# Patient Record
Sex: Female | Born: 1960 | Race: White | Hispanic: No | Marital: Married | Smoking: Never smoker
Health system: Southern US, Community
[De-identification: ages and names within clinical notes are randomized; demographics above are authoritative.]

## PROBLEM LIST (undated history)

## (undated) DIAGNOSIS — J309 Allergic rhinitis, unspecified: Secondary | ICD-10-CM

## (undated) DIAGNOSIS — R7303 Prediabetes: Secondary | ICD-10-CM

## (undated) DIAGNOSIS — D069 Carcinoma in situ of cervix, unspecified: Secondary | ICD-10-CM

## (undated) DIAGNOSIS — Z973 Presence of spectacles and contact lenses: Secondary | ICD-10-CM

## (undated) DIAGNOSIS — E785 Hyperlipidemia, unspecified: Secondary | ICD-10-CM

## (undated) DIAGNOSIS — F32A Depression, unspecified: Secondary | ICD-10-CM

## (undated) DIAGNOSIS — G4733 Obstructive sleep apnea (adult) (pediatric): Secondary | ICD-10-CM

---

## 1995-11-12 HISTORY — PX: REDUCTION MAMMAPLASTY: SUR839

## 2016-09-23 DIAGNOSIS — E785 Hyperlipidemia, unspecified: Secondary | ICD-10-CM | POA: Insufficient documentation

## 2016-09-23 DIAGNOSIS — G4700 Insomnia, unspecified: Secondary | ICD-10-CM | POA: Insufficient documentation

## 2016-09-23 DIAGNOSIS — J309 Allergic rhinitis, unspecified: Secondary | ICD-10-CM | POA: Insufficient documentation

## 2016-09-23 DIAGNOSIS — E119 Type 2 diabetes mellitus without complications: Secondary | ICD-10-CM | POA: Insufficient documentation

## 2019-05-05 DIAGNOSIS — E669 Obesity, unspecified: Secondary | ICD-10-CM | POA: Insufficient documentation

## 2019-05-10 ENCOUNTER — Other Ambulatory Visit: Payer: Self-pay | Admitting: Obstetrics and Gynecology

## 2019-05-10 DIAGNOSIS — Z1231 Encounter for screening mammogram for malignant neoplasm of breast: Secondary | ICD-10-CM

## 2019-05-12 ENCOUNTER — Ambulatory Visit
Admission: RE | Admit: 2019-05-12 | Discharge: 2019-05-12 | Disposition: A | Discharge status: Home / Self Care | Payer: Self-pay | Source: Ambulatory Visit | Attending: Obstetrics and Gynecology | Admitting: Obstetrics and Gynecology

## 2019-05-12 ENCOUNTER — Other Ambulatory Visit: Payer: Self-pay

## 2019-05-12 DIAGNOSIS — Z1231 Encounter for screening mammogram for malignant neoplasm of breast: Secondary | ICD-10-CM

## 2020-06-12 ENCOUNTER — Other Ambulatory Visit: Payer: Self-pay | Admitting: Obstetrics and Gynecology

## 2020-06-12 DIAGNOSIS — Z1231 Encounter for screening mammogram for malignant neoplasm of breast: Secondary | ICD-10-CM

## 2020-06-21 ENCOUNTER — Other Ambulatory Visit: Payer: Self-pay

## 2020-06-21 ENCOUNTER — Ambulatory Visit
Admission: RE | Admit: 2020-06-21 | Discharge: 2020-06-21 | Disposition: A | Discharge status: Home / Self Care | Payer: Self-pay | Source: Ambulatory Visit | Attending: Obstetrics and Gynecology | Admitting: Obstetrics and Gynecology

## 2020-06-21 DIAGNOSIS — Z1231 Encounter for screening mammogram for malignant neoplasm of breast: Secondary | ICD-10-CM

## 2020-09-28 DIAGNOSIS — R87810 Cervical high risk human papillomavirus (HPV) DNA test positive: Secondary | ICD-10-CM | POA: Insufficient documentation

## 2020-10-04 DIAGNOSIS — D069 Carcinoma in situ of cervix, unspecified: Secondary | ICD-10-CM | POA: Insufficient documentation

## 2020-10-26 DIAGNOSIS — E119 Type 2 diabetes mellitus without complications: Secondary | ICD-10-CM | POA: Insufficient documentation

## 2020-10-26 DIAGNOSIS — F32A Depression, unspecified: Secondary | ICD-10-CM | POA: Insufficient documentation

## 2020-10-26 DIAGNOSIS — R87619 Unspecified abnormal cytological findings in specimens from cervix uteri: Secondary | ICD-10-CM | POA: Insufficient documentation

## 2020-12-13 ENCOUNTER — Other Ambulatory Visit: Payer: Self-pay

## 2020-12-13 ENCOUNTER — Other Ambulatory Visit (HOSPITAL_COMMUNITY)
Admission: RE | Admit: 2020-12-13 | Discharge: 2020-12-13 | Disposition: A | Discharge status: Home / Self Care | Payer: 59 | Source: Ambulatory Visit | Attending: Obstetrics and Gynecology | Admitting: Obstetrics and Gynecology

## 2020-12-13 ENCOUNTER — Encounter (HOSPITAL_BASED_OUTPATIENT_CLINIC_OR_DEPARTMENT_OTHER): Payer: Self-pay | Admitting: Obstetrics and Gynecology

## 2020-12-13 DIAGNOSIS — Z20822 Contact with and (suspected) exposure to covid-19: Secondary | ICD-10-CM | POA: Insufficient documentation

## 2020-12-13 DIAGNOSIS — Z01812 Encounter for preprocedural laboratory examination: Secondary | ICD-10-CM | POA: Insufficient documentation

## 2020-12-13 NOTE — Progress Notes (Signed)
Spoke w/ via phone for pre-op interview--- PT Lab needs dos---- Istat, EKG, T&S              Lab results------ no COVID test ------ 12-13-2020 @ 1430 Arrive at ------- 0530 on 12-15-2020 NPO after MN NO Solid Food.  Clear liquids from MN until--- 0430 Medications to take morning of surgery ----- NONE Diabetic medication ----- n/a Patient Special Instructions ----- n/a Pre-Op special Istructions ----- d/c'd dr Jaynee Eagles urine preg pre-op orders since pt's is postmenopausal  Patient verbalized understanding of instructions that were given at this phone interview. Patient denies shortness of breath, chest pain, fever, cough at this phone interview.

## 2020-12-14 LAB — SARS CORONAVIRUS 2 (TAT 6-24 HRS): SARS Coronavirus 2: NEGATIVE

## 2020-12-14 NOTE — Anesthesia Preprocedure Evaluation (Addendum)
Anesthesia Evaluation  Patient identified by MRN, date of birth, ID band Patient awake    Reviewed: Allergy & Precautions, H&P , NPO status , Patient's Chart, lab work & pertinent test results  Airway Mallampati: II  TM Distance: >3 FB Neck ROM: Full    Dental no notable dental hx.    Pulmonary sleep apnea (mild; uses a mouth guard) ,    Pulmonary exam normal breath sounds clear to auscultation       Cardiovascular Exercise Tolerance: Good negative cardio ROS Normal cardiovascular exam Rhythm:Regular Rate:Normal     Neuro/Psych PSYCHIATRIC DISORDERS Depression negative neurological ROS  negative psych ROS   GI/Hepatic negative GI ROS, Neg liver ROS,   Endo/Other  negative endocrine ROShyperlipidemia  Renal/GU negative Renal ROS  negative genitourinary   Musculoskeletal negative musculoskeletal ROS (+)   Abdominal   Peds negative pediatric ROS (+)  Hematology negative hematology ROS (+)   Anesthesia Other Findings   Reproductive/Obstetrics negative OB ROS                            Anesthesia Physical Anesthesia Plan  ASA: II  Anesthesia Plan: General   Post-op Pain Management:    Induction: Intravenous  PONV Risk Score and Plan: Ondansetron, Dexamethasone, Midazolam and Treatment may vary due to age or medical condition  Airway Management Planned: LMA  Additional Equipment:   Intra-op Plan:   Post-operative Plan: Extubation in OR  Informed Consent: I have reviewed the patients History and Physical, chart, labs and discussed the procedure including the risks, benefits and alternatives for the proposed anesthesia with the patient or authorized representative who has indicated his/her understanding and acceptance.     Dental advisory given  Plan Discussed with: CRNA and Anesthesiologist  Anesthesia Plan Comments:        Anesthesia Quick Evaluation

## 2020-12-14 NOTE — H&P (Signed)
Gynecology History and Physical   Julie Maynard is a 60 y.o. 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   OB History   No obstetric history on file.    Past Medical History:  Diagnosis Date  . Allergic rhinitis   . CIN III (cervical intraepithelial neoplasia III)   . Depression   . Hyperlipidemia   . Mild obstructive sleep apnea    per pt had study few years ago, told very mild osa , no cpap recommended but does use mouth guard nightly  . Pre-diabetes   . Wears glasses    Past Surgical History:  Procedure Laterality Date  . REDUCTION MAMMAPLASTY Bilateral 1997   Family History: family history includes Breast cancer in her mother. Social History:  reports that she has never smoked. She has never used smokeless tobacco. She reports previous alcohol use. She reports that she does not use drugs.   Review of Systems History   Height 5\' 6"  (1.676 m), weight 80.3 kg. Exam Physical Exam from office encounter General: well appearing, no apparent distress Chest: nonlabored breathing CV: no peripheral edema Abdomen: nondistended  Assessment/Plan  - CIN3 on ECC.  Patient presents for scheduled cold knife conization.  Given postmenopausal status, will undergo endometrial sampling as well with dilation and curettage.  12/14/2020, 8:22 PM

## 2020-12-15 ENCOUNTER — Other Ambulatory Visit: Payer: Self-pay

## 2020-12-15 ENCOUNTER — Encounter (HOSPITAL_BASED_OUTPATIENT_CLINIC_OR_DEPARTMENT_OTHER): Payer: Self-pay | Admitting: Obstetrics and Gynecology

## 2020-12-15 ENCOUNTER — Ambulatory Visit (HOSPITAL_BASED_OUTPATIENT_CLINIC_OR_DEPARTMENT_OTHER)
Admission: RE | Admit: 2020-12-15 | Discharge: 2020-12-15 | Disposition: A | Discharge status: Home / Self Care | Payer: 59 | Attending: Obstetrics and Gynecology | Admitting: Obstetrics and Gynecology

## 2020-12-15 ENCOUNTER — Ambulatory Visit (HOSPITAL_BASED_OUTPATIENT_CLINIC_OR_DEPARTMENT_OTHER): Payer: 59 | Admitting: Anesthesiology

## 2020-12-15 ENCOUNTER — Encounter (HOSPITAL_BASED_OUTPATIENT_CLINIC_OR_DEPARTMENT_OTHER)
Admission: RE | Disposition: A | Discharge status: Home / Self Care | Payer: Self-pay | Source: Home / Self Care | Attending: Obstetrics and Gynecology

## 2020-12-15 DIAGNOSIS — D069 Carcinoma in situ of cervix, unspecified: Secondary | ICD-10-CM | POA: Diagnosis not present

## 2020-12-15 DIAGNOSIS — N882 Stricture and stenosis of cervix uteri: Secondary | ICD-10-CM | POA: Insufficient documentation

## 2020-12-15 DIAGNOSIS — R7303 Prediabetes: Secondary | ICD-10-CM | POA: Insufficient documentation

## 2020-12-15 DIAGNOSIS — Z803 Family history of malignant neoplasm of breast: Secondary | ICD-10-CM | POA: Diagnosis not present

## 2020-12-15 HISTORY — PX: HYSTEROSCOPY WITH D & C: SHX1775

## 2020-12-15 HISTORY — DX: Obstructive sleep apnea (adult) (pediatric): G47.33

## 2020-12-15 HISTORY — DX: Carcinoma in situ of cervix, unspecified: D06.9

## 2020-12-15 HISTORY — DX: Depression, unspecified: F32.A

## 2020-12-15 HISTORY — DX: Presence of spectacles and contact lenses: Z97.3

## 2020-12-15 HISTORY — DX: Hyperlipidemia, unspecified: E78.5

## 2020-12-15 HISTORY — DX: Allergic rhinitis, unspecified: J30.9

## 2020-12-15 HISTORY — PX: CERVICAL CONIZATION W/BX: SHX1330

## 2020-12-15 HISTORY — DX: Prediabetes: R73.03

## 2020-12-15 LAB — POCT I-STAT, CHEM 8
BUN: 16 mg/dL (ref 6–20)
Calcium, Ion: 1.26 mmol/L (ref 1.15–1.40)
Chloride: 105 mmol/L (ref 98–111)
Creatinine, Ser: 0.6 mg/dL (ref 0.44–1.00)
Glucose, Bld: 132 mg/dL — ABNORMAL HIGH (ref 70–99)
HCT: 41 % (ref 36.0–46.0)
Hemoglobin: 13.9 g/dL (ref 12.0–15.0)
Potassium: 4 mmol/L (ref 3.5–5.1)
Sodium: 142 mmol/L (ref 135–145)
TCO2: 25 mmol/L (ref 22–32)

## 2020-12-15 LAB — TYPE AND SCREEN
ABO/RH(D): A POS
Antibody Screen: NEGATIVE

## 2020-12-15 LAB — GLUCOSE, CAPILLARY: Glucose-Capillary: 117 mg/dL — ABNORMAL HIGH (ref 70–99)

## 2020-12-15 LAB — ABO/RH: ABO/RH(D): A POS

## 2020-12-15 SURGERY — DILATATION AND CURETTAGE /HYSTEROSCOPY
Anesthesia: General

## 2020-12-15 MED ORDER — ACETAMINOPHEN 500 MG PO TABS
ORAL_TABLET | ORAL | Status: AC
  Filled 2020-12-15: qty 2

## 2020-12-15 MED ORDER — PHENYLEPHRINE 40 MCG/ML (10ML) SYRINGE FOR IV PUSH (FOR BLOOD PRESSURE SUPPORT)
PREFILLED_SYRINGE | INTRAVENOUS | Status: AC
  Filled 2020-12-15: qty 10

## 2020-12-15 MED ORDER — FENTANYL CITRATE (PF) 100 MCG/2ML IJ SOLN
INTRAMUSCULAR | Status: AC
  Filled 2020-12-15: qty 2

## 2020-12-15 MED ORDER — FERRIC SUBSULFATE 259 MG/GM EX SOLN
CUTANEOUS | Status: DC | PRN
  Administered 2020-12-15: 1

## 2020-12-15 MED ORDER — DEXAMETHASONE SODIUM PHOSPHATE 10 MG/ML IJ SOLN
INTRAMUSCULAR | Status: DC | PRN
  Administered 2020-12-15: 10 mg via INTRAVENOUS

## 2020-12-15 MED ORDER — FENTANYL CITRATE (PF) 100 MCG/2ML IJ SOLN: 25.0000 ug | INTRAMUSCULAR | Status: DC | PRN

## 2020-12-15 MED ORDER — OXYCODONE HCL 5 MG/5ML PO SOLN
5.0000 mg | Freq: Once | ORAL | Status: DC | PRN
Start: 2020-12-15 — End: 2020-12-15

## 2020-12-15 MED ORDER — PROPOFOL 10 MG/ML IV BOLUS
INTRAVENOUS | Status: DC | PRN
  Administered 2020-12-15: 50 mg via INTRAVENOUS
  Administered 2020-12-15: 150 mg via INTRAVENOUS
  Administered 2020-12-15: 50 mg via INTRAVENOUS

## 2020-12-15 MED ORDER — EPHEDRINE SULFATE-NACL 50-0.9 MG/10ML-% IV SOSY
PREFILLED_SYRINGE | INTRAVENOUS | Status: DC | PRN
  Administered 2020-12-15 (×2): 15 mg via INTRAVENOUS

## 2020-12-15 MED ORDER — LACTATED RINGERS IV SOLN: INTRAVENOUS | Status: DC

## 2020-12-15 MED ORDER — SODIUM CHLORIDE 0.9 % IR SOLN
Status: DC | PRN
  Administered 2020-12-15: 1000 mL

## 2020-12-15 MED ORDER — EPHEDRINE 5 MG/ML INJ
INTRAVENOUS | Status: AC
  Filled 2020-12-15: qty 10

## 2020-12-15 MED ORDER — ONDANSETRON HCL 4 MG/2ML IJ SOLN
INTRAMUSCULAR | Status: AC
  Filled 2020-12-15: qty 2

## 2020-12-15 MED ORDER — DEXAMETHASONE SODIUM PHOSPHATE 10 MG/ML IJ SOLN
INTRAMUSCULAR | Status: AC
  Filled 2020-12-15: qty 1

## 2020-12-15 MED ORDER — ACETAMINOPHEN 500 MG PO TABS
1000.0000 mg | ORAL_TABLET | Freq: Once | ORAL | Status: AC
  Administered 2020-12-15: 1000 mg via ORAL

## 2020-12-15 MED ORDER — AMISULPRIDE (ANTIEMETIC) 5 MG/2ML IV SOLN: 10.0000 mg | Freq: Once | INTRAVENOUS | Status: DC | PRN

## 2020-12-15 MED ORDER — ONDANSETRON HCL 4 MG/2ML IJ SOLN
INTRAMUSCULAR | Status: DC | PRN
  Administered 2020-12-15: 4 mg via INTRAVENOUS

## 2020-12-15 MED ORDER — PROPOFOL 10 MG/ML IV BOLUS
INTRAVENOUS | Status: AC
  Filled 2020-12-15: qty 40

## 2020-12-15 MED ORDER — FENTANYL CITRATE (PF) 100 MCG/2ML IJ SOLN
INTRAMUSCULAR | Status: DC | PRN
  Administered 2020-12-15: 50 ug via INTRAVENOUS

## 2020-12-15 MED ORDER — OXYCODONE HCL 5 MG PO TABS: 5.0000 mg | ORAL_TABLET | ORAL | 0 refills | Status: DC | PRN

## 2020-12-15 MED ORDER — ARTIFICIAL TEARS OPHTHALMIC OINT
TOPICAL_OINTMENT | OPHTHALMIC | Status: AC
  Filled 2020-12-15: qty 3.5

## 2020-12-15 MED ORDER — IBUPROFEN 800 MG PO TABS: 800.0000 mg | ORAL_TABLET | Freq: Three times a day (TID) | ORAL | 0 refills | Status: DC | PRN

## 2020-12-15 MED ORDER — KETOROLAC TROMETHAMINE 30 MG/ML IJ SOLN
INTRAMUSCULAR | Status: AC
  Filled 2020-12-15: qty 1

## 2020-12-15 MED ORDER — VASOPRESSIN 20 UNIT/ML IV SOLN
INTRAVENOUS | Status: DC | PRN
  Administered 2020-12-15: 20 [IU]

## 2020-12-15 MED ORDER — SODIUM CHLORIDE 0.9 % IR SOLN
Status: DC | PRN
  Administered 2020-12-15: 50 mL

## 2020-12-15 MED ORDER — MIDAZOLAM HCL 2 MG/2ML IJ SOLN
INTRAMUSCULAR | Status: AC
  Filled 2020-12-15: qty 2

## 2020-12-15 MED ORDER — LIDOCAINE HCL (PF) 2 % IJ SOLN
INTRAMUSCULAR | Status: AC
  Filled 2020-12-15: qty 5

## 2020-12-15 MED ORDER — POVIDONE-IODINE 10 % EX SWAB: 2.0000 "application " | Freq: Once | CUTANEOUS | Status: DC

## 2020-12-15 MED ORDER — ONDANSETRON HCL 4 MG/2ML IJ SOLN: 4.0000 mg | Freq: Once | INTRAMUSCULAR | Status: DC | PRN

## 2020-12-15 MED ORDER — LIDOCAINE 2% (20 MG/ML) 5 ML SYRINGE
INTRAMUSCULAR | Status: DC | PRN
  Administered 2020-12-15: 80 mg via INTRAVENOUS

## 2020-12-15 MED ORDER — MIDAZOLAM HCL 2 MG/2ML IJ SOLN
INTRAMUSCULAR | Status: DC | PRN
  Administered 2020-12-15: 2 mg via INTRAVENOUS

## 2020-12-15 MED ORDER — ACETAMINOPHEN 325 MG PO TABS: 650.0000 mg | ORAL_TABLET | Freq: Four times a day (QID) | ORAL | 0 refills | Status: DC | PRN

## 2020-12-15 MED ORDER — KETOROLAC TROMETHAMINE 30 MG/ML IJ SOLN
INTRAMUSCULAR | Status: DC | PRN
  Administered 2020-12-15: 30 mg via INTRAVENOUS

## 2020-12-15 MED ORDER — OXYCODONE HCL 5 MG PO TABS
5.0000 mg | ORAL_TABLET | Freq: Once | ORAL | Status: DC | PRN
Start: 2020-12-15 — End: 2020-12-15

## 2020-12-15 SURGICAL SUPPLY — 22 items
CATH ROBINSON RED A/P 16FR (CATHETERS) ×2 IMPLANT
DEVICE MYOSURE LITE (MISCELLANEOUS) IMPLANT
DEVICE MYOSURE REACH (MISCELLANEOUS) IMPLANT
DILATOR CANAL MILEX (MISCELLANEOUS) ×1 IMPLANT
ELECT REM PT RETURN 9FT ADLT (ELECTROSURGICAL) ×2
ELECTRODE REM PT RTRN 9FT ADLT (ELECTROSURGICAL) IMPLANT
GLOVE SURG LTX SZ7 (GLOVE) ×4 IMPLANT
GLOVE SURG UNDER POLY LF SZ7 (GLOVE) ×2 IMPLANT
GOWN STRL REUS W/TWL LRG LVL3 (GOWN DISPOSABLE) ×4 IMPLANT
KIT PROCEDURE FLUENT (KITS) ×3 IMPLANT
KIT TURNOVER CYSTO (KITS) ×2 IMPLANT
PACK VAGINAL MINOR WOMEN LF (CUSTOM PROCEDURE TRAY) ×2 IMPLANT
PAD OB MATERNITY 4.3X12.25 (PERSONAL CARE ITEMS) ×2 IMPLANT
PENCIL BUTTON HOLSTER BLD 10FT (ELECTRODE) ×1 IMPLANT
SEAL ROD LENS SCOPE MYOSURE (ABLATOR) ×2 IMPLANT
SUT SILK 2 0 (SUTURE) ×1
SUT SILK 2-0 18XBRD TIE 12 (SUTURE) IMPLANT
SUT VIC AB 2-0 CT2 27 (SUTURE) ×2 IMPLANT
TOWEL OR 17X26 10 PK STRL BLUE (TOWEL DISPOSABLE) ×4 IMPLANT
TUBING SUCTION 1/4X6FT (MISCELLANEOUS) ×1 IMPLANT
UNDERPAD 30X36 HEAVY ABSORB (UNDERPADS AND DIAPERS) ×2 IMPLANT
YANKAUER SUCT BULB TIP NO VENT (SUCTIONS) ×1 IMPLANT

## 2020-12-15 NOTE — Anesthesia Procedure Notes (Signed)
Procedure Name: LMA Insertion Date/Time: 12/15/2020 7:37 AM Performed by: Norva Pavlov, CRNA Pre-anesthesia Checklist: Patient identified, Emergency Drugs available, Suction available and Patient being monitored Patient Re-evaluated:Patient Re-evaluated prior to induction Oxygen Delivery Method: Circle system utilized Preoxygenation: Pre-oxygenation with 100% oxygen Induction Type: IV induction Ventilation: Mask ventilation without difficulty LMA: LMA inserted LMA Size: 4.0 Number of attempts: 1 Airway Equipment and Method: Bite block Placement Confirmation: positive ETCO2 Tube secured with: Tape Dental Injury: Teeth and Oropharynx as per pre-operative assessment

## 2020-12-15 NOTE — Transfer of Care (Signed)
Immediate Anesthesia Transfer of Care Note  Patient: Julie Maynard  Procedure(s) Performed: Procedure(s) (LRB): DILATATION AND CURETTAGE /HYSTEROSCOPY (N/A) CONIZATION CERVIX WITH BIOPSY (N/A)  Patient Location: PACU  Anesthesia Type: General  Level of Consciousness: awake, alert  and oriented  Airway & Oxygen Therapy: Patient Spontanous Breathing and Patient connected to nasal cannula oxygen  Post-op Assessment: Report given to PACU RN and Post -op Vital signs reviewed and stable  Post vital signs: Reviewed and stable  Complications: No apparent anesthesia complications  Last Vitals:  Vitals Value Taken Time  BP 128/65 12/15/20 0840  Temp    Pulse 88 12/15/20 0843  Resp 10 12/15/20 0843  SpO2 100 % 12/15/20 0843  Vitals shown include unvalidated device data.  Last Pain:  Vitals:   12/15/20 0651  TempSrc: Oral  PainSc: 0-No pain      Patients Stated Pain Goal: 5 (12/15/20 6811)  Complications: No complications documented.

## 2020-12-15 NOTE — H&P (Signed)
No Changes to the below information.  Will proceed with procedure as planned.  All questions answered.   Nilda Simmer MD   Gynecology History and Physical   Julie Maynard is a 60 y.o. 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   OB History   No obstetric history on file.    Past Medical History:  Diagnosis Date  . Allergic rhinitis   . CIN III (cervical intraepithelial neoplasia III)   . Depression   . Hyperlipidemia   . Mild obstructive sleep apnea    per pt had study few years ago, told very mild osa , no cpap recommended but does use mouth guard nightly  . Pre-diabetes   . Wears glasses    Past Surgical History:  Procedure Laterality Date  . REDUCTION MAMMAPLASTY Bilateral 1997   Family History: family history includes Breast cancer in her mother. Social History:  reports that she has never smoked. She has never used smokeless tobacco. She reports previous alcohol use. She reports that she does not use drugs.   Review of Systems History   Blood pressure 121/75, pulse 82, temperature 98.5 F (36.9 C), temperature source Oral, resp. rate 15, height 5' 5.75" (1.67 m), weight 81.8 kg, SpO2 98 %. Exam Physical Exam from office encounter General: well appearing, no apparent distress Chest: nonlabored breathing CV: no peripheral edema Abdomen: nondistended  Assessment/Plan  - CIN3 on ECC.  Patient presents for scheduled cold knife conization.  Given postmenopausal status, will undergo endometrial sampling as well with dilation and curettage.  Lyn Henri 12/15/2020, 7:21 AM

## 2020-12-15 NOTE — Op Note (Signed)
DATE OF PROCEDURE:  12/15/2020  PREOPERATIVE DIAGNOSES: 1. CIN 3  POSTOPERATIVE DIAGNOSES: 1. Same  PROCEDURES PERFORMED: 1. Cold knife cone biopsy, hysteroscopy and dilation and curettage   SURGEON: Nilda Simmer, MD  ANESTHESIA: General.  PROCEDURE FINDINGS: Normal appearing cervix, uterus sounded to ~7 cm  INDICATION: Patient presented with cervical dysplasia.  Pap smear was completed and was NILM but positive HPV.  On colposcopy, ECC was collected and noted to have CIN3.  CKC and endometrial sampling (for postmenopausal status) was recommended. Risks and benefits and indications were discussed in detail and consent obtained.  DESCRIPTION OF PROCEDURE: The patient was taken to the operating room and placed in the supine position on the operating table, where general anesthesia was administered. She was then placed in the dorsal lithotomy position where examination under anesthesia was performed. The patient was prepped and draped in the usual manner for surgery.   A weighted speculum was inserted into the vagina. The cervix was exposed with a retractor, and painted with Acetic acid. The anterior lip of the cervix was grasped with a single-toothed tenaculum. The cervix was circumferentially injected with vasopressin. Angle sutures of 0 Vicryl were placed at the 3 o'clock and 9 o'clock positions of the cervix. The cervix was infiltrated with diluted Pitressin solution. Cone biopsy was then excised with an 45 degree scalpel blade and mayo scissor. Cervix os was then located and dilated to accommodate hysteroscope.  Cervical stenosis was noted, however uterine cavity was partially visualized and appeared normal.  ECC was completed and endometrial curettage was completed with very little tissue return. The cone biopsy bed was then cauterized with Bovie, hemostasis was noted.  The stay sutures were tied down, but not to one another. Single tooth tenaculum was removed and a figure of eight suture of  vicryl was placed for hemostasis.  Monsel's was added for additional hemostasis. All instruments were removed. The patient was returned to the supine position. The patient was awakened from anesthesia without any difficulty and transferred to the recovery room in good condition. The patient tolerated the procedure well.   Nilda Simmer MD

## 2020-12-15 NOTE — Discharge Instructions (Signed)

## 2020-12-15 NOTE — Anesthesia Postprocedure Evaluation (Signed)
Anesthesia Post Note  Patient: [name]  Procedure(s) Performed: DILATATION AND CURETTAGE /HYSTEROSCOPY (N/A ) CONIZATION CERVIX WITH BIOPSY (N/A )     Patient location during evaluation: PACU Anesthesia Type: General Level of consciousness: awake Pain management: pain level controlled Vital Signs Assessment: post-procedure vital signs reviewed and stable Respiratory status: spontaneous breathing and respiratory function stable Cardiovascular status: stable Postop Assessment: no apparent nausea or vomiting Anesthetic complications: no   No complications documented.  Last Vitals:  Vitals:   12/15/20 0900 12/15/20 0915  BP: 96/76 (!) 126/59  Pulse: 86 85  Resp: 14 14  Temp:    SpO2: 100% 94%    Last Pain:  Vitals:   12/15/20 0900  TempSrc:   PainSc: 0-No pain                 Mellody Dance

## 2020-12-18 ENCOUNTER — Encounter (HOSPITAL_BASED_OUTPATIENT_CLINIC_OR_DEPARTMENT_OTHER): Payer: Self-pay | Admitting: Obstetrics and Gynecology

## 2020-12-18 LAB — SURGICAL PATHOLOGY

## 2021-05-18 LAB — COLOGUARD: COLOGUARD: NEGATIVE

## 2021-08-02 IMAGING — MG DIGITAL SCREENING BILAT W/ CAD
5 series · 5 of 5 positions shown · non-contrast
Comparison: Previous exam(s).

CLINICAL DATA: Screening.

EXAM:
DIGITAL SCREENING BILATERAL MAMMOGRAM WITH CAD

[L CC (1 of 2)]
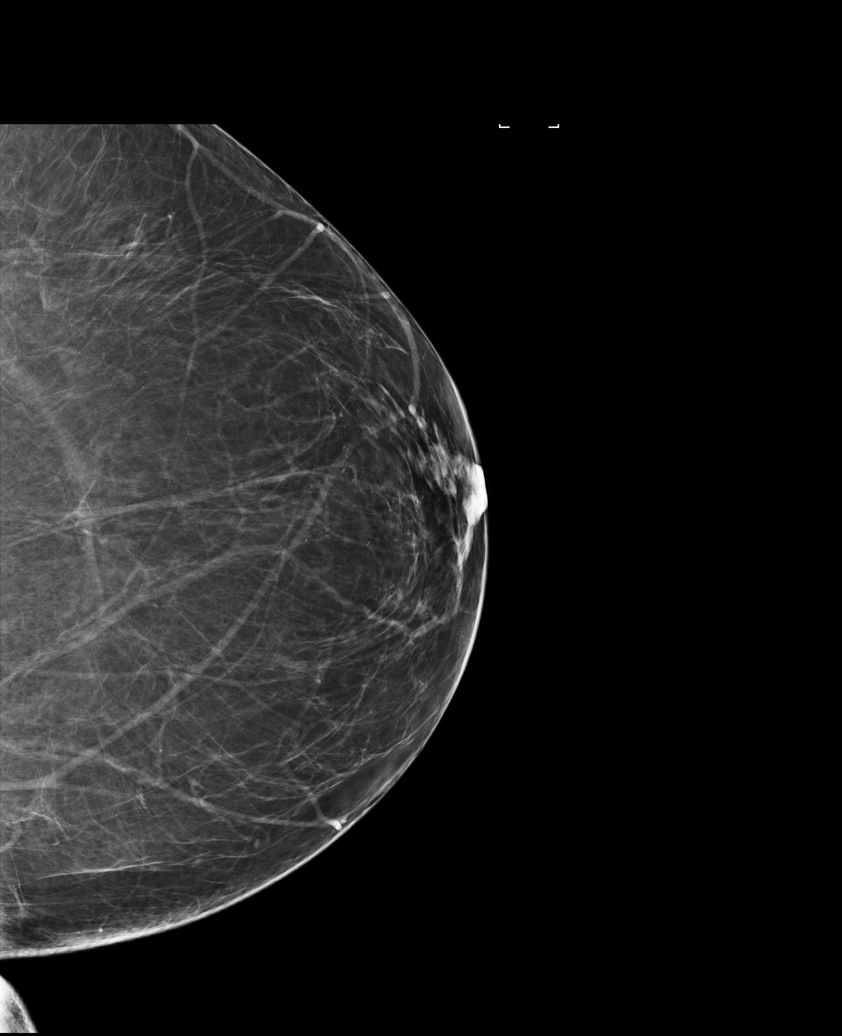

[R CC]
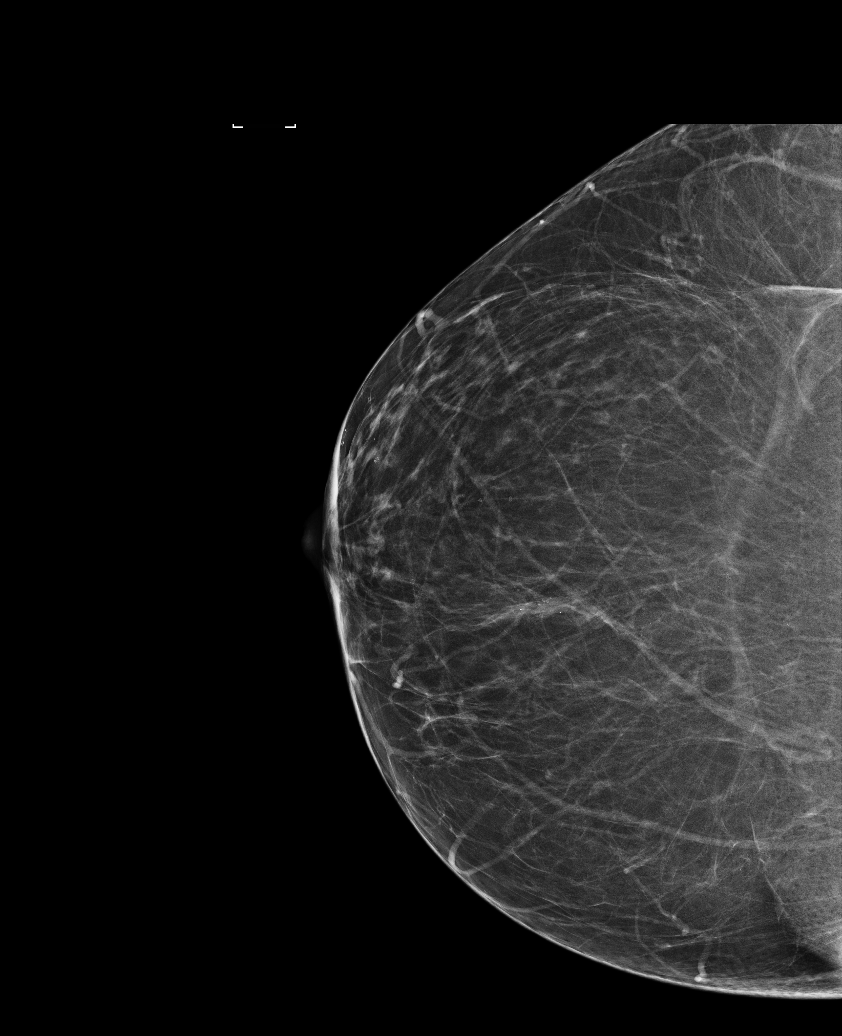

[L CC (2 of 2)]
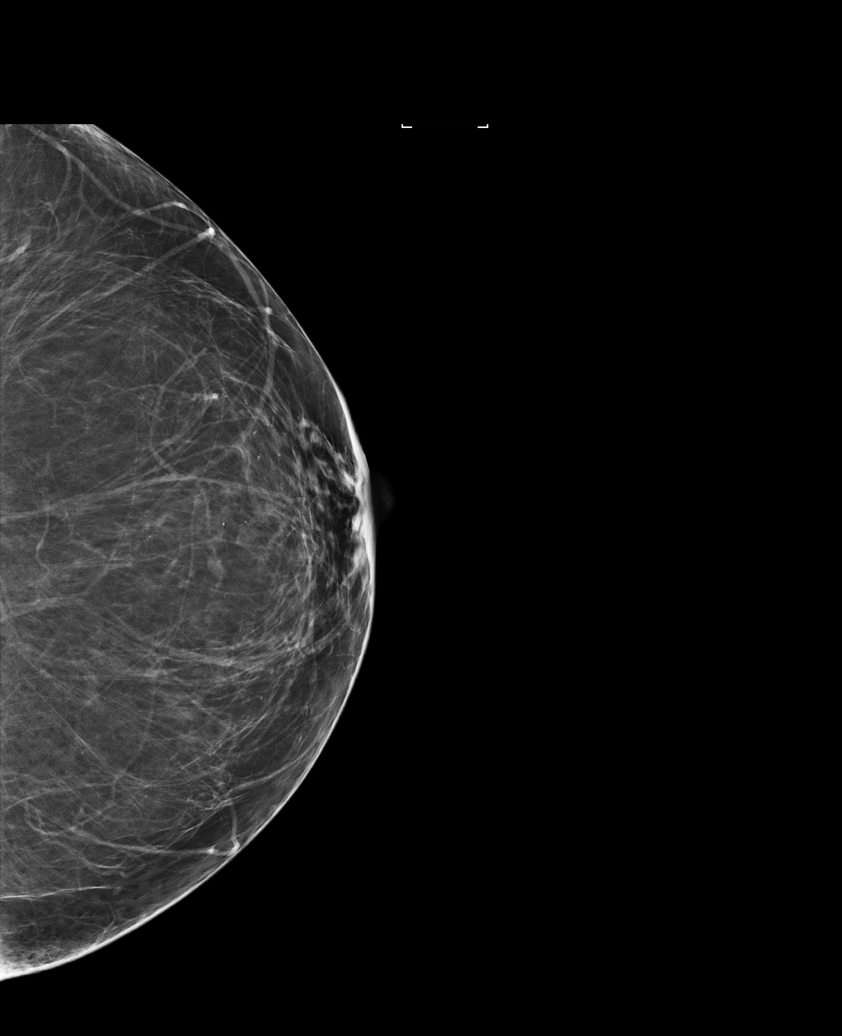

[R MLO]
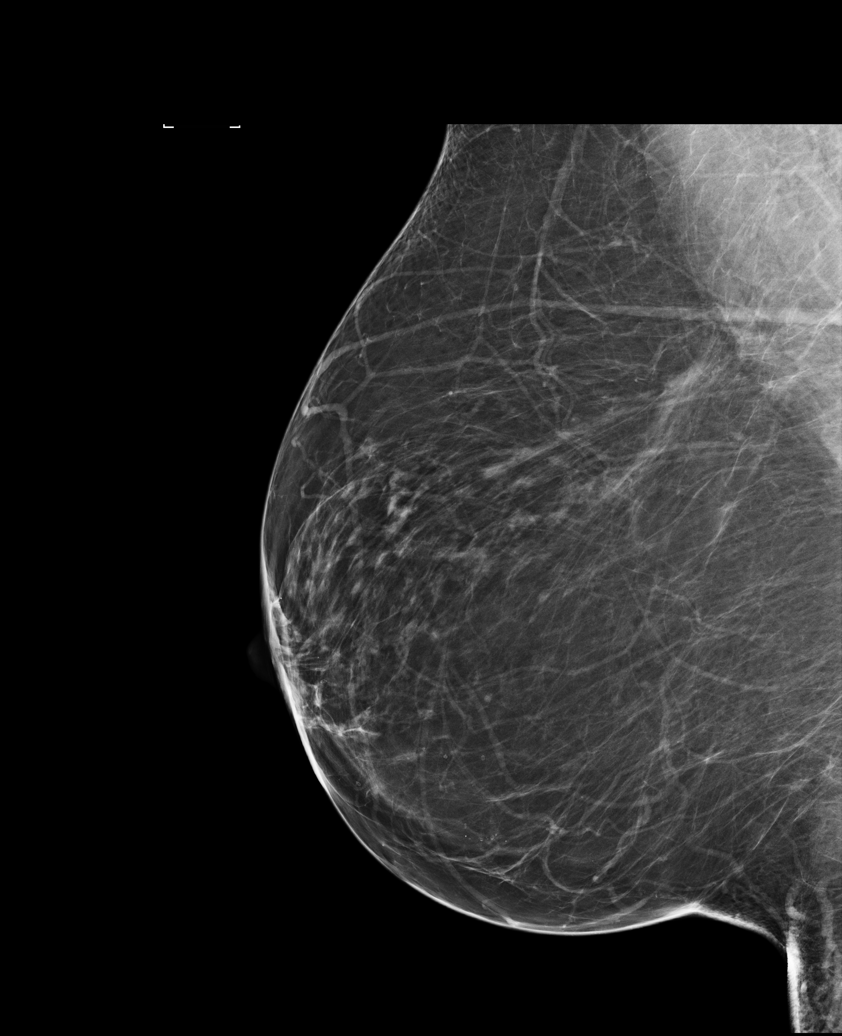

[L MLO]
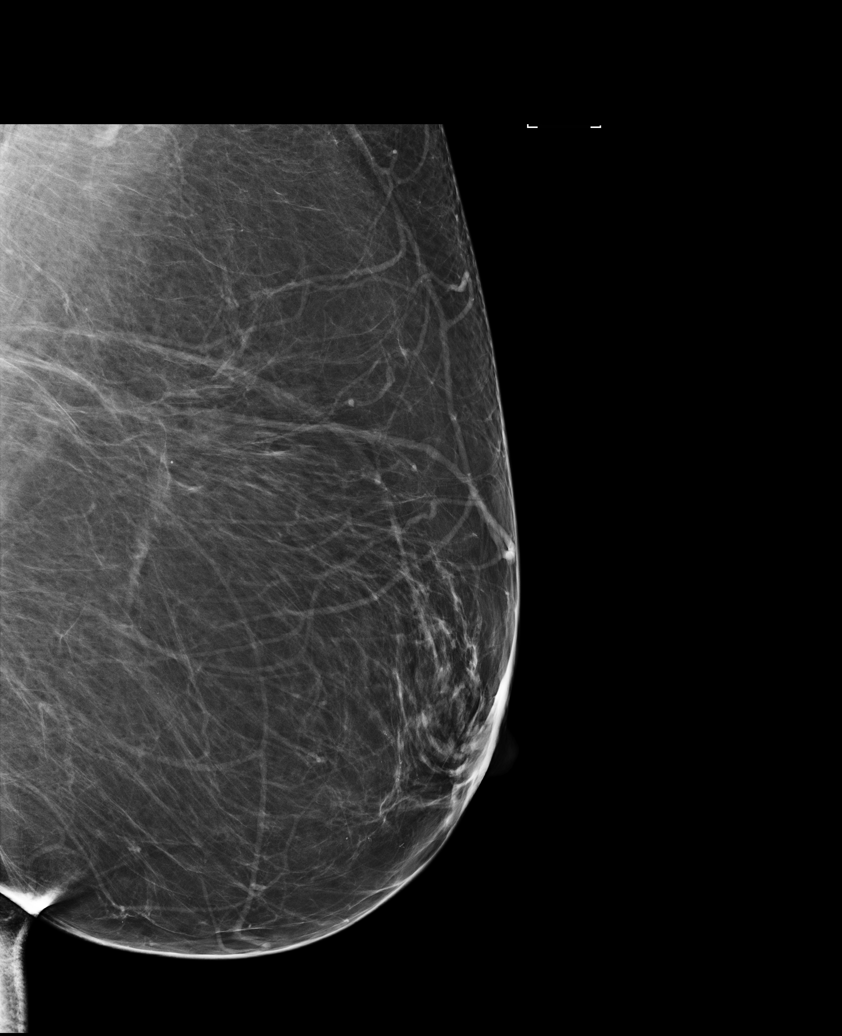

[5 of 5 positions shown; findings below may reference images not displayed]

ACR Breast Density Category b: There are scattered areas of
fibroglandular density.
FINDINGS: There are no findings suspicious for malignancy. Images were
processed with CAD.
IMPRESSION: No mammographic evidence of malignancy. A result letter of this
screening mammogram will be mailed directly to the patient.

RECOMMENDATION:
Screening mammogram in one year. (Code:AS-G-LCT)

BI-RADS CATEGORY  1: Negative.

## 2022-03-04 LAB — HM MAMMOGRAPHY

## 2022-03-05 LAB — HM PAP SMEAR: HM Pap smear: NORMAL

## 2022-03-05 LAB — RESULTS CONSOLE HPV: CHL HPV: POSITIVE

## 2022-03-19 ENCOUNTER — Ambulatory Visit (INDEPENDENT_AMBULATORY_CARE_PROVIDER_SITE_OTHER): Payer: Self-pay | Admitting: Family Medicine

## 2022-03-19 ENCOUNTER — Encounter: Payer: Self-pay | Admitting: Family Medicine

## 2022-03-19 VITALS — BP 124/70 | HR 82 | Temp 98.3°F | Ht 66.0 in | Wt 180.0 lb

## 2022-03-19 DIAGNOSIS — F32A Depression, unspecified: Secondary | ICD-10-CM

## 2022-03-19 DIAGNOSIS — E782 Mixed hyperlipidemia: Secondary | ICD-10-CM

## 2022-03-19 DIAGNOSIS — Z23 Encounter for immunization: Secondary | ICD-10-CM

## 2022-03-19 DIAGNOSIS — R251 Tremor, unspecified: Secondary | ICD-10-CM

## 2022-03-19 DIAGNOSIS — F5101 Primary insomnia: Secondary | ICD-10-CM

## 2022-03-19 DIAGNOSIS — E119 Type 2 diabetes mellitus without complications: Secondary | ICD-10-CM

## 2022-03-19 DIAGNOSIS — D069 Carcinoma in situ of cervix, unspecified: Secondary | ICD-10-CM

## 2022-03-19 MED ORDER — LORAZEPAM 0.5 MG PO TABS: 0.5000 mg | ORAL_TABLET | Freq: Two times a day (BID) | ORAL | 0 refills | Status: DC | PRN

## 2022-03-19 MED ORDER — PAROXETINE HCL 20 MG PO TABS: 20.0000 mg | ORAL_TABLET | Freq: Every day | ORAL | 3 refills | Status: DC

## 2022-03-19 NOTE — Progress Notes (Signed)
?[facility] ?[facility] ?[address] ?[place] [id number] ?Dept: [phone] ?Dept Fax: [phone] ? ?New Patient Office Visit ? ?Subjective:  ? ? Patient ID: Julie Maynard, female    DOB: [birth year], 61 y.o..   MRN: [id number] ? ?Chief Complaint  ?Patient presents with  ? Establish Care  ?  NP-establish care.  No concerns.     ? ? ?History of Present Illness: ? ?Patient is in today to establish care. Julie Maynard was born in [place], [place]. Her family moved around the state, but eventually ended up in [place], where she attended [employer]. She went to college at [employer], obtaining BS degrees in Engineer, technical salesBusiness Administration and Accounting. 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 She has been married for 13 years. She has two children: a son 90(32) who is a high functioning person with autism, and a daughter 60(26) who she describes as "an unmotivated millennial". Both live at home with her. She denies any tobacco or drug use. She rarely drinks alcohol. ? ?Julie Maynard has a history of insomnia. She notes that she had a sleep study some years ago that showed borderline OSA. She was started on gabapentin at bedtime, which has managed this nicely. She also takes some melatonin to help initiate sleep more rapidly. ? ?Julie Maynard has a history of depression with some anxiety. he had tried going off of her Paxil, but found during tax season she was much more on edge. She is titrating back up to 20 mg daily on this. She also has used an occasional dose of lorazepam for anxiety. ? ?Julie Maynard has a history of hyperlipidemia. She is managed on atorvastatin 40 mg daily. ? ?Julie Maynard previously had an A1c that was in the diabetes range. She notes she lost 40 lbs (down from 220 lb) and her A1c normalized.  ? 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 She underwent a conization procedure. She notes she had a recent pap smear with her  GYN which was normal. ? ?Julie Maynard notes that she has been having an issue with tremor. She states clients occasionally note this. She also has an associated head bobbing/nodding that occurs. She notes this seems to be a familial trait. ? ?Past Medical History: ?Patient Active Problem List  ? Diagnosis Date Noted  ? Depressive disorder 10/26/2020  ? CIN III (cervical intraepithelial neoplasia grade III) with severe dysplasia 10/04/2020  ? Obesity (BMI 30.0-34.9) 05/05/2019  ? Type 2 diabetes mellitus without complication, without long-term current use of insulin (HCC) 09/23/2016  ? Insomnia 09/23/2016  ? Hyperlipidemia 09/23/2016  ? Allergic rhinitis 09/23/2016  ? ?Past Surgical History:  ?Procedure Laterality Date  ? CERVICAL CONIZATION W/BX N/A 12/15/2020  ? Procedure: CONIZATION CERVIX WITH BIOPSY;  Surgeon: Lyn HenriGlasser, Timothy A, MD;  [facility];  Service: Gynecology;  Laterality: N/A;  ? HYSTEROSCOPY WITH D & C N/A 12/15/2020  ? Procedure: DILATATION AND CURETTAGE /HYSTEROSCOPY;  Surgeon: Lyn HenriGlasser, Timothy A, MD;  [facility] ;  Service: Gynecology;  Laterality: N/A;  ? REDUCTION MAMMAPLASTY Bilateral 1997  ? ?Family History  ?Problem Relation Age of Onset  ? Cancer Mother   ?     Breast, pancreatic  ? COPD Father   ? Cancer Father   ?     Lung  ? Stroke Maternal Uncle   ? Heart disease Maternal Grandmother   ? Cancer  Maternal Grandfather   ?     Lung  ? Kidney disease Paternal Grandfather   ? ?Outpatient Medications Prior to Visit  ?Medication Sig Dispense Refill  ? acetaminophen (TYLENOL) 325 MG tablet Take 2 tablets (650 mg total) by mouth every 6 (six) hours as needed. 30 tablet 0  ? gabapentin (NEURONTIN) 100 MG capsule Take 100 mg by mouth at bedtime.    ? ibuprofen (ADVIL) 800 MG tablet Take 1 tablet (800 mg total) by mouth every 8 (eight) hours as needed. 30 tablet 0  ? melatonin 5 MG TABS Take 5 mg by mouth at bedtime.    ? Multiple Vitamins-Minerals (AIRBORNE  PO) Take by mouth daily.    ? Omega-3 Fatty Acids (FISH OIL) 1000 MG CAPS Take by mouth.    ? PARoxetine (PAXIL) 30 MG tablet Take 30 mg by mouth at bedtime.    ? simvastatin (ZOCOR) 40 MG tablet Take 40 mg by mouth at bedtime.    ? UNABLE TO FIND Take by mouth 3 (three) times daily. Med Name:  GLUCOCIL SUPPLEMENT -- takes one at breakfast , one at lunch and two at supper    ? vitamin B-12 (CYANOCOBALAMIN) 500 MCG tablet Take 500 mcg by mouth daily.    ? LORazepam (ATIVAN) 0.5 MG tablet Take 1 tablet by mouth 2 (two) times daily as needed.    ? oxyCODONE (ROXICODONE) 5 MG immediate release tablet Take 1 tablet (5 mg total) by mouth every 4 (four) hours as needed for severe pain. 5 tablet 0  ? ?No facility-administered medications prior to visit.  ? ?Allergies  ?Allergen Reactions  ? Father Johns Medicine [Dextromethorphan Hbr] Hives  ? Other Hives  ?  Tussin cough medication  ?  ?Objective:  ? ?Today's Vitals  ? 03/19/22 1423  ?BP: 124/70  ?Pulse: 82  ?Temp: 98.3 ?F (36.8 ?C)  ?TempSrc: Temporal  ?SpO2: 97%  ?Weight: 180 lb (81.6 kg)  ?Height: 5\' 6"  (1.676 m)  ? ?Body mass index is 29.05 kg/m?.  ? ?General: Well developed, well nourished. No acute distress. ?Neuro: No focal neurological deficits. No tremor noted. ?Psych: Alert and oriented. Normal mood and affect. ? ?Health Maintenance Due  ?Topic Date Due  ? HEMOGLOBIN A1C  Never done  ? FOOT EXAM  Never done  ? OPHTHALMOLOGY EXAM  Never done  ? URINE MICROALBUMIN  Never done  ? HIV Screening  Never done  ? Hepatitis C Screening  Never done  ? COVID-19 Vaccine (4 - Booster for Pfizer series) 11/29/2020  ?   ? ?  03/19/2022  ?  3:06 PM  ?Depression screen PHQ 2/9  ?Decreased Interest 1  ?Down, Depressed, Hopeless 2  ?PHQ - 2 Score 3  ?Altered sleeping 2  ?Tired, decreased energy 1  ?Change in appetite 2  ?Feeling bad or failure about yourself  3  ?Trouble concentrating 2  ?Moving slowly or fidgety/restless 0  ?Suicidal thoughts 0  ?PHQ-9 Score 13  ?Difficult doing  work/chores Somewhat difficult  ? ? ?  03/19/2022  ?  3:07 PM  ?GAD 7 : Generalized Anxiety Score  ?Nervous, Anxious, on Edge 3  ?Control/stop worrying 2  ?Worry too much - different things 2  ?Trouble relaxing 1  ?Restless 0  ?Easily annoyed or irritable 1  ?Afraid - awful might happen 3  ?Total GAD 7 Score 12  ?Anxiety Difficulty Very difficult  ? ? ? ?Assessment & Plan:  ? ?1. Type 2 diabetes mellitus without complication, without  long-term current use of insulin (HCC) ?I will check annual DM labs today. We will decide about further treatment based on her A1c results. ? ?- Microalbumin / creatinine urine ratio ?- Hemoglobin A1c ?- Urinalysis, Routine w reflex microscopic ?- Comprehensive metabolic panel ? ?2. CIN III (cervical intraepithelial neoplasia grade III) with severe dysplasia ?Recent pap normal s/p cervical conization. Continue to follow with GYN. ? ?3. Depressive disorder ?I agree with her titrating back up on her Paxil, as her GAD7 and PHQ9 results are moderately high. I will continue judicious use of lorazepam. ? ?- LORazepam (ATIVAN) 0.5 MG tablet; Take 1 tablet (0.5 mg total) by mouth 2 (two) times daily as needed.  Dispense: 30 tablet; Refill: 0 ? ?4. Primary insomnia ?Stable on gabapentin and melatonin. ? ?5. Mixed hyperlipidemia ?I will check lipids to determine adequacy of her current statin. ? ?- Lipid panel ? ?6. Tremor ?This is likely an essential tremor. I will check screenign labs. ?- Comprehensive metabolic panel ?- TSH ?- Vitamin B12 ? ?Return in about 3 months (around 06/19/2022) for Reassessment.  ? ?Loyola Mast, MD ?

## 2022-03-20 LAB — COMPREHENSIVE METABOLIC PANEL
ALT: 33 U/L (ref 0–35)
AST: 31 U/L (ref 0–37)
Albumin: 4.6 g/dL (ref 3.5–5.2)
Alkaline Phosphatase: 61 U/L (ref 39–117)
BUN: 10 mg/dL (ref 6–23)
CO2: 29 mEq/L (ref 19–32)
Calcium: 9.7 mg/dL (ref 8.4–10.5)
Chloride: 102 mEq/L (ref 96–112)
Creatinine, Ser: 0.7 mg/dL (ref 0.40–1.20)
GFR: 92.98 mL/min (ref >60.00)
Glucose, Bld: 118 mg/dL — ABNORMAL HIGH (ref 70–99)
Potassium: 3.9 mEq/L (ref 3.5–5.1)
Sodium: 140 mEq/L (ref 135–145)
Total Bilirubin: 0.7 mg/dL (ref 0.2–1.2)
Total Protein: 7.4 g/dL (ref 6.0–8.3)

## 2022-03-20 LAB — HEMOGLOBIN A1C: Hgb A1c MFr Bld: 6.6 % — ABNORMAL HIGH (ref 4.6–6.5)

## 2022-03-20 LAB — URINALYSIS, ROUTINE W REFLEX MICROSCOPIC
Bilirubin Urine: NEGATIVE
Hgb urine dipstick: NEGATIVE
Ketones, ur: NEGATIVE
Nitrite: NEGATIVE
Specific Gravity, Urine: 1.02 (ref 1.000–1.030)
Total Protein, Urine: NEGATIVE
Urine Glucose: NEGATIVE
Urobilinogen, UA: 0.2 (ref 0.0–1.0)
pH: 6 (ref 5.0–8.0)

## 2022-03-20 LAB — LIPID PANEL
Cholesterol: 139 mg/dL (ref 0–200)
HDL: 62.2 mg/dL (ref >39.00)
LDL Cholesterol: 57 mg/dL (ref 0–99)
NonHDL: 76.42
Total CHOL/HDL Ratio: 2
Triglycerides: 96 mg/dL (ref 0.0–149.0)
VLDL: 19.2 mg/dL (ref 0.0–40.0)

## 2022-03-20 LAB — MICROALBUMIN / CREATININE URINE RATIO
Creatinine,U: 127.5 mg/dL
Microalb Creat Ratio: 1.4 mg/g (ref 0.0–30.0)
Microalb, Ur: 1.8 mg/dL (ref 0.0–1.9)

## 2022-03-20 LAB — VITAMIN B12: Vitamin B-12: 845 pg/mL (ref 211–911)

## 2022-03-20 LAB — TSH: TSH: 1.61 u[IU]/mL (ref 0.35–5.50)

## 2022-03-29 ENCOUNTER — Telehealth: Payer: Self-pay | Admitting: Family Medicine

## 2022-03-29 NOTE — Telephone Encounter (Signed)
Pt needs her Lorazepam refilled.

## 2022-03-29 NOTE — Telephone Encounter (Signed)
Patient notified that Rx was sent to [facility] and will go There to pick it up. Dm/cma

## 2022-05-03 ENCOUNTER — Other Ambulatory Visit: Payer: Self-pay

## 2022-05-03 MED ORDER — SIMVASTATIN 40 MG PO TABS: 40.0000 mg | ORAL_TABLET | Freq: Every day | ORAL | 3 refills | Status: DC

## 2022-05-03 MED ORDER — GABAPENTIN 100 MG PO CAPS: 100.0000 mg | ORAL_CAPSULE | Freq: Every day | ORAL | 3 refills | Status: DC

## 2022-05-03 NOTE — Telephone Encounter (Signed)
Refill request for  Gabapentin 100 mg LR  hx provider  Simvastatin 40 mg LR hx provider LOV 03/19/22 FOV  none scheduled  Please review and advise.  Thanks. Dm/cma

## 2022-05-15 ENCOUNTER — Ambulatory Visit (INDEPENDENT_AMBULATORY_CARE_PROVIDER_SITE_OTHER): Payer: Self-pay

## 2022-05-15 DIAGNOSIS — Z23 Encounter for immunization: Secondary | ICD-10-CM

## 2022-05-15 NOTE — Progress Notes (Signed)
Per orders of Dr. Veto Kemps pt is here for 2nd dose of Shingles vaccine. Pt received 2nd dose shingles vaccine in Left Deltoid at 3:15. Given by [place] L. CMA/CPT, Pt tolerated 2nd dose shingles vaccine well.  Dose 2/2. Series completed.

## 2022-06-10 LAB — HM DIABETES EYE EXAM

## 2022-08-26 ENCOUNTER — Encounter: Payer: Self-pay | Admitting: Nurse Practitioner

## 2022-08-26 ENCOUNTER — Ambulatory Visit (INDEPENDENT_AMBULATORY_CARE_PROVIDER_SITE_OTHER): Payer: Self-pay | Admitting: Nurse Practitioner

## 2022-08-26 VITALS — BP 131/58 | HR 95 | Temp 97.1°F | Wt 184.6 lb

## 2022-08-26 DIAGNOSIS — R21 Rash and other nonspecific skin eruption: Secondary | ICD-10-CM

## 2022-08-26 MED ORDER — TRIAMCINOLONE ACETONIDE 0.1 % EX CREA: 1.0000 | TOPICAL_CREAM | Freq: Two times a day (BID) | CUTANEOUS | 1 refills | Status: DC

## 2022-08-26 NOTE — Progress Notes (Unsigned)
   Acute Office Visit  Subjective:     Patient ID: Julie Maynard, female    DOB: 1961-08-29, 61 y.o.   MRN: [id number]  Chief Complaint  Patient presents with   Acute Visit    Pt c/o itchy rash-like spots on both legs, RT torso, LT breast x5 days. Pt denies pain    HPI Patient is in today for rash to her left shin, right torso, and left breast.   RASH  Duration:  days  Location: left leg, right trunk, left breast  Itching: yes Burning: no Redness: yes Oozing: no Scaling: no Blisters: no Painful: no Fevers: no Change in detergents/soaps/personal care products: no Recent illness: no Recent travel:no History of same: no Context: fluctuating Alleviating factors: benadryl Treatments attempted:benadryl Shortness of breath: no  Throat/tongue swelling: no Myalgias/arthralgias: no  ROS See pertinent positives and negatives per HPI.     Objective:    BP (!) 131/58 (BP Location: Left Arm, Patient Position: Sitting)   Pulse 95   Temp (!) 97.1 F (36.2 C) (Temporal)   Wt 184 lb 9.6 oz (83.7 kg)   SpO2 97%   BMI 29.80 kg/m    Physical Exam Vitals and nursing note reviewed.  Constitutional:      General: She is not in acute distress.    Appearance: Normal appearance.  HENT:     Head: Normocephalic.  Eyes:     Conjunctiva/sclera: Conjunctivae normal.  Cardiovascular:     Rate and Rhythm: Normal rate and regular rhythm.     Pulses: Normal pulses.     Heart sounds: Normal heart sounds.  Pulmonary:     Effort: Pulmonary effort is normal.     Breath sounds: Normal breath sounds.  Musculoskeletal:     Cervical back: Normal range of motion.  Skin:    General: Skin is warm.     Findings: Rash present.     Comments: Macular linear rash to left shin, right torso, and left breast  Neurological:     General: No focal deficit present.     Mental Status: She is alert and oriented to person, place, and time.  Psychiatric:        Mood and Affect: Mood normal.         Behavior: Behavior normal.        Thought Content: Thought content normal.        Judgment: Judgment normal.       Assessment & Plan:   Problem List Items Addressed This Visit       Musculoskeletal and Integument   Rash - Primary    Appears to be a contact dermatitis. With being on both sides of the body, less likely shingles. No recent changes in soap, laundry detergent. Recommend she wash her sheets and clothes in hot water. Start triamcinolone cream BID to the affected areas. Follow-up if not improving.        Meds ordered this encounter  Medications   triamcinolone cream (KENALOG) 0.1 %    Sig: Apply 1 Application topically 2 (two) times daily.    Dispense:  30 g    Refill:  1    No follow-ups on file.  Charyl Dancer, NP

## 2022-08-26 NOTE — Patient Instructions (Signed)
It was great to see you!  Start triamcinolone cream twice a day to the rash. I would start an over the counter claritin or zyrtec (loratadine or cetirizine) daily. You can also take benadryl as needed.   Let's follow-up if your symptoms worsen or don't improve.   Take care,  Vance Peper, NP

## 2022-08-27 ENCOUNTER — Telehealth: Payer: Self-pay | Admitting: Family Medicine

## 2022-08-27 DIAGNOSIS — R21 Rash and other nonspecific skin eruption: Secondary | ICD-10-CM | POA: Insufficient documentation

## 2022-08-27 MED ORDER — PREDNISONE 10 MG PO TABS: ORAL_TABLET | ORAL | 0 refills | Status: DC

## 2022-08-27 NOTE — Assessment & Plan Note (Signed)
Appears to be a contact dermatitis. With being on both sides of the body, less likely shingles. No recent changes in soap, laundry detergent. Recommend she wash her sheets and clothes in hot water. Start triamcinolone cream BID to the affected areas. Follow-up if not improving.

## 2022-08-27 NOTE — Telephone Encounter (Signed)
Pt want to know if a prescription  of cortozone be called in to [facility]

## 2022-08-28 NOTE — Telephone Encounter (Signed)
Called and ldvm, medication sent to pharmacy

## 2022-09-04 ENCOUNTER — Telehealth: Payer: Self-pay | Admitting: Family Medicine

## 2022-09-04 NOTE — Telephone Encounter (Signed)
Caller Name: Madysun Thall Call back phone #: [phone]  Reason for Call: Pt went to dermatologist and was given 1 month supply of prednisone

## 2022-09-04 NOTE — Telephone Encounter (Signed)
Pt was seen on 08/26/22 for a rash by Lauren. The rash on her leg is not angry looking, the one on her abd is spreading and under her breast. Please advise pt @ [phone].

## 2022-09-04 NOTE — Telephone Encounter (Signed)
Spoke to patient stating she did take the prednisone and it kept spreading not getting any better.  Please review and advise. Thanks Dm/cma

## 2022-09-04 NOTE — Telephone Encounter (Signed)
Lft detailed VM to rtn call to schedule an appointment to be re-evaluated. Dm/cma

## 2022-09-04 NOTE — Telephone Encounter (Signed)
Noted. Dm/cma  

## 2022-11-29 ENCOUNTER — Telehealth: Payer: Self-pay | Admitting: Family Medicine

## 2022-11-29 NOTE — Telephone Encounter (Signed)
Scheduled appointment for 12/03/22.  Should be okay till he appointment. Dm/cma

## 2022-11-29 NOTE — Telephone Encounter (Signed)
Caller Name: Doyne Call back phone #: [phone]   MEDICATION(S):   LORazepam (ATIVAN) 0.5 MG tablet   Days of Med Remaining: 2 pills  Has the patient contacted their pharmacy (YES/NO)? Yes - they said they have sent 2 requests, nothing showing in electronic record  Preferred Pharmacy:  [facility]

## 2022-12-03 ENCOUNTER — Encounter: Payer: Self-pay | Admitting: Family Medicine

## 2022-12-03 ENCOUNTER — Ambulatory Visit (INDEPENDENT_AMBULATORY_CARE_PROVIDER_SITE_OTHER): Payer: Self-pay | Admitting: Family Medicine

## 2022-12-03 VITALS — BP 124/82 | HR 78 | Temp 96.9°F | Ht 66.0 in | Wt 192.8 lb

## 2022-12-03 DIAGNOSIS — F32A Depression, unspecified: Secondary | ICD-10-CM

## 2022-12-03 DIAGNOSIS — Z636 Dependent relative needing care at home: Secondary | ICD-10-CM

## 2022-12-03 DIAGNOSIS — Z1159 Encounter for screening for other viral diseases: Secondary | ICD-10-CM

## 2022-12-03 DIAGNOSIS — E782 Mixed hyperlipidemia: Secondary | ICD-10-CM

## 2022-12-03 DIAGNOSIS — E119 Type 2 diabetes mellitus without complications: Secondary | ICD-10-CM

## 2022-12-03 LAB — GLUCOSE, RANDOM: Glucose, Bld: 137 mg/dL — ABNORMAL HIGH (ref 70–99)

## 2022-12-03 LAB — HEMOGLOBIN A1C: Hgb A1c MFr Bld: 7.1 % — ABNORMAL HIGH (ref 4.6–6.5)

## 2022-12-03 MED ORDER — PAROXETINE HCL 40 MG PO TABS: 40.0000 mg | ORAL_TABLET | Freq: Every day | ORAL | 3 refills | Status: DC

## 2022-12-03 MED ORDER — LORAZEPAM 0.5 MG PO TABS: 0.5000 mg | ORAL_TABLET | Freq: Two times a day (BID) | ORAL | 0 refills | Status: AC | PRN

## 2022-12-03 MED ORDER — METFORMIN HCL 500 MG PO TABS: 500.0000 mg | ORAL_TABLET | Freq: Two times a day (BID) | ORAL | 3 refills | Status: DC

## 2022-12-03 NOTE — Progress Notes (Signed)
[facility] [address] [place] [address] Dept: [phone] Dept Fax: [phone]  Chronic Care Office Visit  Subjective:    Patient ID: Julie Maynard, female    DOB: 16-Aug-1961, 62 y.o..   MRN: [id number]  Chief Complaint  Patient presents with   Follow-up    F/u meds.  No Concerns    History of Present Illness:  Patient is in today for reassessment of chronic medical issues.  Julie Maynard has a history of depression with some anxiety. She notes several stressors that have impacted her mood over the last year. 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 That partner's husband currently has leukemia and she is serving as a Land for him. 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 She feels she is being hard on herself about this. Her depression has been managed in the past on Paxil 20 mg daily. About four weeks ago, she increased her dose to 40 mg daily. However, she feels like she is not having any positive benefit yet. She continues to take an occasional lorazepam for anxiety and wonders about using this daily.   Julie Maynard has a history of hyperlipidemia. She is managed on atorvastatin 40 mg daily.   Julie Maynard has a history of Type 2 diabetes. She was able to manage this with weight loss and is not requiring medication.  Past Medical History: Patient Active Problem List   Diagnosis Date Noted   Depressive disorder 10/26/2020   CIN III (cervical intraepithelial neoplasia grade III) with severe dysplasia 10/04/2020   Obesity (BMI 30.0-34.9) 05/05/2019   Type 2 diabetes mellitus without complication, without long-term current use of insulin ([place]) 09/23/2016   Insomnia 09/23/2016   Hyperlipidemia 09/23/2016   Allergic rhinitis 09/23/2016   Past Surgical History:  Procedure Laterality Date   CERVICAL CONIZATION W/BX N/A 12/15/2020   Procedure: CONIZATION  CERVIX WITH BIOPSY;  Surgeon: Carlyon Shadow, MD;  Location: [facility];  Service: Gynecology;  Laterality: N/A;   HYSTEROSCOPY WITH D & C N/A 12/15/2020   Procedure: DILATATION AND CURETTAGE /HYSTEROSCOPY;  Surgeon: Carlyon Shadow, MD;  Location: [facility];  Service: Gynecology;  Laterality: N/A;   REDUCTION MAMMAPLASTY Bilateral 1997   Family History  Problem Relation Age of Onset   Cancer Mother        Breast, pancreatic   COPD Father    Cancer Father        Lung   Stroke Maternal Uncle    Heart disease Maternal Grandmother    Cancer Maternal Grandfather        Lung   Kidney disease Paternal Grandfather    Outpatient Medications Prior to Visit  Medication Sig Dispense Refill   acetaminophen (TYLENOL) 325 MG tablet Take 2 tablets (650 mg total) by mouth every 6 (six) hours as needed. 30 tablet 0   gabapentin (NEURONTIN) 100 MG capsule Take 1 capsule (100 mg total) by mouth at bedtime. 90 capsule 3   melatonin 5 MG TABS Take 5 mg by mouth at bedtime.     Multiple Vitamins-Minerals (AIRBORNE PO) Take by mouth daily.     Omega-3 Fatty Acids (FISH OIL) 1000 MG CAPS Take by mouth.     simvastatin (ZOCOR) 40 MG tablet Take 1 tablet (40 mg total) by mouth at bedtime. 90 tablet 3   triamcinolone cream (KENALOG) 0.1 % Apply 1 Application topically 2 (  two) times daily. 30 g 1   UNABLE TO FIND Take by mouth 3 (three) times daily. Med Name:  GLUCOCIL SUPPLEMENT -- takes one at breakfast , one at lunch and two at supper     vitamin B-12 (CYANOCOBALAMIN) 500 MCG tablet Take 500 mcg by mouth daily.     LORazepam (ATIVAN) 0.5 MG tablet Take 1 tablet (0.5 mg total) by mouth 2 (two) times daily as needed. 30 tablet 0   PARoxetine (PAXIL) 20 MG tablet Take 1 tablet (20 mg total) by mouth daily. 90 tablet 3   ibuprofen (ADVIL) 800 MG tablet Take 1 tablet (800 mg total) by mouth every 8 (eight) hours as needed. (Patient not taking: Reported on 12/03/2022) 30 tablet 0    predniSONE (DELTASONE) 10 MG tablet Take 6 tablets today, then 5 tablets tomorrow, then decrease by 1 tablet every day until gone. Take with food. 21 tablet 0   No facility-administered medications prior to visit.   Allergies  Allergen Reactions   Father Johns Medicine [Dextromethorphan Hbr] Hives   Other Hives    Tussin cough medication    Objective:   Today's Vitals   12/03/22 0812  BP: 124/82  Pulse: 78  Temp: (!) 96.9 F (36.1 C)  TempSrc: Tympanic  SpO2: 98%  Weight: 192 lb 12.8 oz (87.5 kg)  Height: 5\' 6"  (1.676 m)   Body mass index is 31.12 kg/m.   General: Well developed, well nourished. No acute distress. Feet- Skin intact. No sign of maceration between toes. Nails are normal. Dorsalis pedis and posterior tibial   artery pulses are normal. 5.07 monofilament testing normal. Psych: Alert and oriented. Normal mood and affect.  Health Maintenance Due  Topic Date Due   OPHTHALMOLOGY EXAM  Never done   HIV Screening  Never done   Hepatitis C Screening  Never done   MAMMOGRAM  06/21/2022   HEMOGLOBIN A1C  09/19/2022      12/03/2022    8:24 AM 03/19/2022    3:06 PM  Depression screen PHQ 2/9  Decreased Interest 2 1  Down, Depressed, Hopeless 1 2  PHQ - 2 Score 3 3  Altered sleeping 2 2  Tired, decreased energy 1 1  Change in appetite 3 2  Feeling bad or failure about yourself  2 3  Trouble concentrating 1 2  Moving slowly or fidgety/restless 2 0  Suicidal thoughts 0 0  PHQ-9 Score 14 13  Difficult doing work/chores  Somewhat difficult      12/03/2022    8:25 AM 03/19/2022    3:07 PM  GAD 7 : Generalized Anxiety Score  Nervous, Anxious, on Edge 2 3  Control/stop worrying 3 2  Worry too much - different things 2 2  Trouble relaxing 2 1  Restless 1 0  Easily annoyed or irritable 2 1  Afraid - awful might happen 3 3  Total GAD 7 Score 15 12  Anxiety Difficulty Somewhat difficult Very difficult   Assessment & Plan:   1. Type 2 diabetes mellitus  without complication, without long-term current use of insulin (HCC) Diabetes has been well controlled with diet. I will check her A1c today.  - Glucose, random - Hemoglobin A1c  2. Mixed hyperlipidemia Continue simvastatin 40 mg daily.  3. Depressive disorder I agree with her decision to increase her Paxil. If not feeling improvement after another 2 weeks, I recommend she try going up to 60 mg daily. 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  - LORazepam (ATIVAN) 0.5 MG tablet; Take 1 tablet (0.5 mg total) by mouth 2 (two) times daily as needed.  Dispense: 30 tablet; Refill: 0 - PARoxetine (PAXIL) 40 MG tablet; Take 1 tablet (40 mg total) by mouth daily.  Dispense: 90 tablet; Refill: 3  4. Caregiver stress As above.  5. Encounter for hepatitis C screening test for low risk patient  - HCV Ab w Reflex to Quant PCR  Return in about 6 weeks (around 01/14/2023) for Reassessment.   Haydee Salter, MD

## 2022-12-03 NOTE — Addendum Note (Signed)
Addended by: Haydee Salter on: 12/03/2022 05:50 PM   Modules accepted: Orders

## 2022-12-04 ENCOUNTER — Encounter: Payer: Self-pay | Admitting: Family Medicine

## 2022-12-05 ENCOUNTER — Encounter: Payer: Self-pay | Admitting: Family Medicine

## 2022-12-05 LAB — HCV AB W REFLEX TO QUANT PCR: HCV Ab: NONREACTIVE

## 2022-12-05 LAB — HCV INTERPRETATION

## 2023-01-14 ENCOUNTER — Encounter: Payer: Self-pay | Admitting: Family Medicine

## 2023-01-14 ENCOUNTER — Ambulatory Visit (INDEPENDENT_AMBULATORY_CARE_PROVIDER_SITE_OTHER): Payer: Self-pay | Admitting: Family Medicine

## 2023-01-14 VITALS — BP 120/64 | HR 91 | Temp 97.7°F | Ht 66.0 in | Wt 185.6 lb

## 2023-01-14 DIAGNOSIS — F32A Depression, unspecified: Secondary | ICD-10-CM

## 2023-01-14 DIAGNOSIS — E119 Type 2 diabetes mellitus without complications: Secondary | ICD-10-CM

## 2023-01-14 NOTE — Progress Notes (Signed)
[facility] [address] [place] [address] Dept: [phone] Dept Fax: [phone]  Office Visit  Subjective:    Patient ID: Julie Maynard, female    DOB: 04/05/1961, 62 y.o..   MRN: [id number]  Chief Complaint  Patient presents with   Diabetes    Check up, no concerns   History of Present Illness:  Patient is in today for follow-up regarding her depression. I had seen Julie Maynard 6 weeks ago. She noted several stressors that have impacted her mood over the last year. 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 I increased her Paxil to 40 mg a day.  Since that last visit, she notes her partner's husband has died. 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 Despite this, she feels she is doing better with her depression and feels the Paxil has helped with this.  Julie Maynard has a history of Type 2 diabetes. She had been able to manage this with weight loss and had not required medication. Her recent A1c was above goal. I prescribed metformin. She has had some GI issues the first two weeks, which caused her to be more mindful of what she is eating. She is tolerating this better now.  Past Medical History: Patient Active Problem List   Diagnosis Date Noted   Depressive disorder 10/26/2020   CIN III (cervical intraepithelial neoplasia grade III) with severe dysplasia 10/04/2020   Pap smear of cervix shows high risk HPV present 09/28/2020   Obesity (BMI 30.0-34.9) 05/05/2019   Type 2 diabetes mellitus without complication, without long-term current use of insulin ([place]) 09/23/2016   Insomnia 09/23/2016   Hyperlipidemia 09/23/2016   Allergic rhinitis  09/23/2016   Past Surgical History:  Procedure Laterality Date   CERVICAL CONIZATION W/BX N/A 12/15/2020   Procedure: CONIZATION CERVIX WITH BIOPSY;  Surgeon: Carlyon Shadow, MD;  Location: [facility];  Service: Gynecology;  Laterality: N/A;   HYSTEROSCOPY WITH D & C N/A 12/15/2020   Procedure: DILATATION AND CURETTAGE /HYSTEROSCOPY;  Surgeon: Carlyon Shadow, MD;  Location: [facility];  Service: Gynecology;  Laterality: N/A;   REDUCTION MAMMAPLASTY Bilateral 1997   Family History  Problem Relation Age of Onset   Cancer Mother        Breast, pancreatic   COPD Father    Cancer Father        Lung   Stroke Maternal Uncle    Heart disease Maternal Grandmother    Cancer Maternal Grandfather        Lung   Kidney disease Paternal Grandfather    Outpatient Medications Prior to Visit  Medication Sig Dispense Refill   gabapentin (NEURONTIN) 100 MG capsule Take 1 capsule (100 mg total) by mouth at bedtime. 90 capsule 3   ibuprofen (ADVIL) 800 MG tablet Take 1 tablet (800 mg total) by mouth every 8 (eight) hours as needed. 30 tablet 0   LORazepam (ATIVAN) 0.5 MG tablet Take 1 tablet (0.5 mg total) by mouth 2 (two) times daily as needed. 30 tablet 0   melatonin 5 MG TABS Take 5 mg by mouth at bedtime.     metFORMIN (GLUCOPHAGE) 500 MG  tablet Take 1 tablet (500 mg total) by mouth 2 (two) times daily with a meal. 180 tablet 3   Multiple Vitamins-Minerals (AIRBORNE PO) Take by mouth daily.     Omega-3 Fatty Acids (FISH OIL) 1000 MG CAPS Take by mouth.     PARoxetine (PAXIL) 40 MG tablet Take 1 tablet (40 mg total) by mouth daily. 90 tablet 3   simvastatin (ZOCOR) 40 MG tablet Take 1 tablet (40 mg total) by mouth at bedtime. 90 tablet 3   triamcinolone cream (KENALOG) 0.1 % Apply 1 Application topically 2 (two) times daily. 30 g 1   UNABLE TO FIND Take by mouth 3 (three) times daily. Med Name:  GLUCOCIL SUPPLEMENT -- takes one at breakfast , one at lunch and two  at supper     vitamin B-12 (CYANOCOBALAMIN) 500 MCG tablet Take 500 mcg by mouth daily.     acetaminophen (TYLENOL) 325 MG tablet Take 2 tablets (650 mg total) by mouth every 6 (six) hours as needed. (Patient not taking: Reported on 01/14/2023) 30 tablet 0   No facility-administered medications prior to visit.   Allergies  Allergen Reactions   Father Johns Medicine [Dextromethorphan Hbr] Hives   Other Hives    Tussin cough medication     Objective:   Today's Vitals   01/14/23 1510  BP: 120/64  Pulse: 91  Temp: 97.7 F (36.5 C)  SpO2: 94%  Weight: 185 lb 9.6 oz (84.2 kg)  Height: '5\' 6"'$  (1.676 m)   Body mass index is 29.96 kg/m.   General: Well developed, well nourished. No acute distress. Psych: Alert and oriented. Normal mood and affect.  Health Maintenance Due  Topic Date Due   HIV Screening  Never done   COVID-19 Vaccine (4 - 2023-24 season) 07/12/2022     Assessment & Plan:   Problem List Items Addressed This Visit       Endocrine   Type 2 diabetes mellitus without complication, without long-term current use of insulin (HCC)    Continue metformin 500 mg daily. I will see her back in 6 weeks to reassess her A1c.        Other   Depressive disorder - Primary    Improved. I will plant o continue her on Paxil 40 mg daily for 6 months. We can then consider backing down on her dose.       Return in about 6 weeks (around 02/25/2023) for Reassessment.   Haydee Salter, MD

## 2023-01-14 NOTE — Assessment & Plan Note (Signed)
Continue metformin 500 mg daily. I will see her back in 6 weeks to reassess her A1c.

## 2023-01-14 NOTE — Assessment & Plan Note (Signed)
Improved. I will plant o continue her on Paxil 40 mg daily for 6 months. We can then consider backing down on her dose.

## 2023-01-15 ENCOUNTER — Telehealth: Payer: Self-pay | Admitting: Family Medicine

## 2023-01-15 NOTE — Telephone Encounter (Signed)
Left VM to rtn call.  Her son will have to see an urgent care for the cyst today. And then she can scheduled to establish care with Dr Gena Fray.  Dm/cma

## 2023-01-15 NOTE — Telephone Encounter (Signed)
Mom of Julie Maynard [id number] stated that Dr Gena Fray said he would see her son even without being established. He has a cyst that has burst. Please call to schedule or let her know otherwise.

## 2023-02-12 LAB — HM DIABETES EYE EXAM

## 2023-03-13 ENCOUNTER — Ambulatory Visit (INDEPENDENT_AMBULATORY_CARE_PROVIDER_SITE_OTHER): Payer: Self-pay | Admitting: Family Medicine

## 2023-03-13 ENCOUNTER — Encounter: Payer: Self-pay | Admitting: Family Medicine

## 2023-03-13 VITALS — Temp 98.0°F | Ht 66.0 in | Wt 186.6 lb

## 2023-03-13 DIAGNOSIS — E782 Mixed hyperlipidemia: Secondary | ICD-10-CM

## 2023-03-13 DIAGNOSIS — E119 Type 2 diabetes mellitus without complications: Secondary | ICD-10-CM

## 2023-03-13 DIAGNOSIS — Z7984 Long term (current) use of oral hypoglycemic drugs: Secondary | ICD-10-CM

## 2023-03-13 DIAGNOSIS — F32A Depression, unspecified: Secondary | ICD-10-CM

## 2023-03-13 LAB — URINALYSIS, ROUTINE W REFLEX MICROSCOPIC
Bilirubin Urine: NEGATIVE
Hgb urine dipstick: NEGATIVE
Ketones, ur: NEGATIVE
Leukocytes,Ua: NEGATIVE
Nitrite: NEGATIVE
Specific Gravity, Urine: 1.01 (ref 1.000–1.030)
Total Protein, Urine: NEGATIVE
Urine Glucose: NEGATIVE
Urobilinogen, UA: 0.2 (ref 0.0–1.0)
pH: 6.5 (ref 5.0–8.0)

## 2023-03-13 LAB — LIPID PANEL
Cholesterol: 160 mg/dL (ref 0–200)
HDL: 72.1 mg/dL (ref >39.00)
LDL Cholesterol: 64 mg/dL (ref 0–99)
NonHDL: 88.07
Total CHOL/HDL Ratio: 2
Triglycerides: 122 mg/dL (ref 0.0–149.0)
VLDL: 24.4 mg/dL (ref 0.0–40.0)

## 2023-03-13 LAB — BASIC METABOLIC PANEL
BUN: 13 mg/dL (ref 6–23)
CO2: 28 mEq/L (ref 19–32)
Calcium: 9.8 mg/dL (ref 8.4–10.5)
Chloride: 100 mEq/L (ref 96–112)
Creatinine, Ser: 0.61 mg/dL (ref 0.40–1.20)
GFR: 95.45 mL/min (ref >60.00)
Glucose, Bld: 142 mg/dL — ABNORMAL HIGH (ref 70–99)
Potassium: 4.3 mEq/L (ref 3.5–5.1)
Sodium: 139 mEq/L (ref 135–145)

## 2023-03-13 LAB — HEMOGLOBIN A1C: Hgb A1c MFr Bld: 6.6 % — ABNORMAL HIGH (ref 4.6–6.5)

## 2023-03-13 LAB — MICROALBUMIN / CREATININE URINE RATIO
Creatinine,U: 52.9 mg/dL
Microalb Creat Ratio: 1.3 mg/g (ref 0.0–30.0)
Microalb, Ur: <0.7 mg/dL (ref 0.0–1.9)

## 2023-03-13 NOTE — Assessment & Plan Note (Signed)
Improved. Continue Paxil 40 mg daily for 4 months. We can then consider backing down on her dose.

## 2023-03-13 NOTE — Assessment & Plan Note (Signed)
We will check lipids today. Continue simvastatin 40 mg daily.  

## 2023-03-13 NOTE — Assessment & Plan Note (Signed)
We will check an A1c today and annual kidney screening. Continue metformin 500 mg daily.

## 2023-03-13 NOTE — Progress Notes (Signed)
[facility] [address] [place] [address] Dept: [phone] Dept Fax: [phone]  Chronic Care Office Visit  Subjective:    Patient ID: Julie Maynard, female    DOB: [birth year], 62 y.o..   MRN: [id number]  Chief Complaint  Patient presents with   Medical Management of Chronic Issues    6 week f/u.   No concerns.    History of Present Illness:  Patient is in today for reassessment of chronic medical issues.  [name] has a history of hyperlipidemia. She is managed on simvastatin 40 mg daily.   [name] has a history of Type 2 diabetes. She is now on metformin 500 mg daily.  [name] has a history of depression with some anxiety. She is currently managed on paroxetine 40 mg daily. She had a flare of her issues this winter, brought on by the annual stressors of tax season and the recent death of a close friend. She feels she is doing better now that tax season has passed.   Past Medical History: Patient Active Problem List   Diagnosis Date Noted   Depressive disorder 10/26/2020   CIN III (cervical intraepithelial neoplasia grade III) with severe dysplasia 10/04/2020   Pap smear of cervix shows high risk HPV present 09/28/2020   Obesity (BMI 30.0-34.9) 05/05/2019   Type 2 diabetes mellitus without complication, without long-term current use of insulin (HCC) 09/23/2016   Insomnia 09/23/2016   Hyperlipidemia 09/23/2016   Allergic rhinitis 09/23/2016   Past Surgical History:  Procedure Laterality Date   CERVICAL CONIZATION W/BX N/A 12/15/2020   Procedure: CONIZATION CERVIX WITH BIOPSY;  Surgeon: Lyn Henri, MD;  Location: [facility];  Service: Gynecology;  Laterality: N/A;   HYSTEROSCOPY WITH D & C N/A 12/15/2020   Procedure: DILATATION AND CURETTAGE /HYSTEROSCOPY;  Surgeon: Lyn Henri, MD;  Location: [facility];  Service: Gynecology;  Laterality: N/A;   REDUCTION  MAMMAPLASTY Bilateral 1997   Family History  Problem Relation Age of Onset   Cancer Mother        Breast, pancreatic   COPD Father    Cancer Father        Lung   Stroke Maternal Uncle    Heart disease Maternal Grandmother    Cancer Maternal Grandfather        Lung   Kidney disease Paternal Grandfather    Outpatient Medications Prior to Visit  Medication Sig Dispense Refill   Cinnamon 500 MG TABS Take 1,000 mg by mouth daily.     gabapentin (NEURONTIN) 100 MG capsule Take 1 capsule (100 mg total) by mouth at bedtime. 90 capsule 3   ibuprofen (ADVIL) 800 MG tablet Take 1 tablet (800 mg total) by mouth every 8 (eight) hours as needed. 30 tablet 0   LORazepam (ATIVAN) 0.5 MG tablet Take 1 tablet (0.5 mg total) by mouth 2 (two) times daily as needed. 30 tablet 0   melatonin 5 MG TABS Take 5 mg by mouth at bedtime.     metFORMIN (GLUCOPHAGE) 500 MG tablet Take 1 tablet (500 mg total) by mouth 2 (two) times daily with a meal. 180 tablet 3   Multiple Vitamins-Minerals (AIRBORNE PO) Take by mouth daily.     PARoxetine (PAXIL) 40 MG tablet Take 1 tablet (40 mg total) by mouth daily. 90 tablet 3   simvastatin (ZOCOR) 40 MG tablet Take 1 tablet (40 mg total) by mouth at bedtime. 90 tablet 3  triamcinolone cream (KENALOG) 0.1 % Apply 1 Application topically 2 (two) times daily. 30 g 1   UNABLE TO FIND Take by mouth 3 (three) times daily. Med Name:  GLUCOCIL SUPPLEMENT -- takes one at breakfast , one at lunch and two at supper     vitamin B-12 (CYANOCOBALAMIN) 500 MCG tablet Take 500 mcg by mouth daily.     acetaminophen (TYLENOL) 325 MG tablet Take 2 tablets (650 mg total) by mouth every 6 (six) hours as needed. (Patient not taking: Reported on 01/14/2023) 30 tablet 0   Omega-3 Fatty Acids (FISH OIL) 1000 MG CAPS Take by mouth. (Patient not taking: Reported on 03/13/2023)     No facility-administered medications prior to visit.   Allergies  Allergen Reactions   Father Johns Medicine  [Dextromethorphan Hbr] Hives   Other Hives    Tussin cough medication   Objective:   Today's Vitals   03/13/23 0841  Temp: 98 F (36.7 C)  TempSrc: Temporal  Weight: 186 lb 9.6 oz (84.6 kg)  Height: 5\' 6"  (1.676 m)   Body mass index is 30.12 kg/m.   General: Well developed, well nourished. No acute distress. Psych: Alert and oriented. Normal mood and affect.  Health Maintenance Due  Topic Date Due   HIV Screening  Never done   Diabetic kidney evaluation - eGFR measurement  03/20/2023   Diabetic kidney evaluation - Urine ACR  03/20/2023     Assessment & Plan:   Problem List Items Addressed This Visit       Endocrine   Type 2 diabetes mellitus without complication, without long-term current use of insulin (HCC) - Primary    We will check an A1c today and annual kidney screening. Continue metformin 500 mg daily.       Relevant Orders   Microalbumin / creatinine urine ratio   Basic metabolic panel   Hemoglobin A1c   Urinalysis, Routine w reflex microscopic     Other   Depressive disorder    Improved. Continue Paxil 40 mg daily for 4 months. We can then consider backing down on her dose.      Hyperlipidemia    We will check lipids today. Continue simvastatin 40 mg daily.      Relevant Orders   Lipid panel    Return in about 6 months (around 09/13/2023) for Reassessment.   Loyola Mast, MD

## 2023-04-14 ENCOUNTER — Encounter: Payer: Self-pay | Admitting: Family Medicine

## 2023-04-17 ENCOUNTER — Other Ambulatory Visit: Payer: Self-pay | Admitting: Family Medicine

## 2023-06-04 ENCOUNTER — Other Ambulatory Visit: Payer: Self-pay

## 2023-06-04 MED ORDER — GABAPENTIN 100 MG PO CAPS: 100.0000 mg | ORAL_CAPSULE | Freq: Every day | ORAL | 3 refills | Status: DC

## 2023-06-04 NOTE — Telephone Encounter (Signed)
Refill request for  Gabapentin 100 mg LR  05/03/22, #90, 3 rf LOV 03/13/23 FOV  09/12/23  Please review and advise.   Thanks. Dm/cma

## 2023-06-16 ENCOUNTER — Other Ambulatory Visit: Payer: Self-pay | Admitting: Family Medicine

## 2023-07-19 ENCOUNTER — Other Ambulatory Visit: Payer: Self-pay | Admitting: Family Medicine

## 2023-08-27 ENCOUNTER — Encounter: Payer: Self-pay | Admitting: Family Medicine

## 2023-08-27 ENCOUNTER — Ambulatory Visit (INDEPENDENT_AMBULATORY_CARE_PROVIDER_SITE_OTHER): Payer: Self-pay | Admitting: Family Medicine

## 2023-08-27 VITALS — BP 124/70 | HR 92 | Temp 97.9°F | Ht 66.0 in | Wt 194.4 lb

## 2023-08-27 DIAGNOSIS — E782 Mixed hyperlipidemia: Secondary | ICD-10-CM

## 2023-08-27 DIAGNOSIS — Z23 Encounter for immunization: Secondary | ICD-10-CM

## 2023-08-27 DIAGNOSIS — Z7984 Long term (current) use of oral hypoglycemic drugs: Secondary | ICD-10-CM

## 2023-08-27 DIAGNOSIS — M7581 Other shoulder lesions, right shoulder: Secondary | ICD-10-CM

## 2023-08-27 DIAGNOSIS — E119 Type 2 diabetes mellitus without complications: Secondary | ICD-10-CM

## 2023-08-27 LAB — HEMOGLOBIN A1C: Hgb A1c MFr Bld: 7.4 % — ABNORMAL HIGH (ref 4.6–6.5)

## 2023-08-27 LAB — GLUCOSE, RANDOM: Glucose, Bld: 281 mg/dL — ABNORMAL HIGH (ref 70–99)

## 2023-08-27 MED ORDER — NAPROXEN 500 MG PO TABS
500.0000 mg | ORAL_TABLET | Freq: Two times a day (BID) | ORAL | 0 refills | Status: AC
Start: 2023-08-27 — End: ?

## 2023-08-27 MED ORDER — SITAGLIPTIN PHOSPHATE 25 MG PO TABS
25.0000 mg | ORAL_TABLET | Freq: Every day | ORAL | 3 refills | Status: DC
Start: 2023-08-27 — End: 2023-08-29

## 2023-08-27 NOTE — Assessment & Plan Note (Signed)
Lipids are at goal. Continue simvastatin 40 mg daily.

## 2023-08-27 NOTE — Assessment & Plan Note (Signed)
History and exam is consistent with a right shoulder impingement syndrome, likely rotator cuff tendinitis. I will treat her with a 7-day course of naproxen 500 mg bid, heat, and pendulum exercises. If not improving/resolved with this, I would consider a steroid injection.

## 2023-08-27 NOTE — Assessment & Plan Note (Signed)
I will check an A1c today. If her A1c is above 7% would consider addition of a different diabetes medicine.

## 2023-08-27 NOTE — Addendum Note (Signed)
Addended by: Loyola Mast on: 08/27/2023 01:00 PM   Modules accepted: Orders

## 2023-08-27 NOTE — Progress Notes (Signed)
[facility] [address] [place] [address] Dept: [phone] Dept Fax: [phone]  Chronic Care Office Visit  Subjective:    Patient ID: Julie Maynard, female    DOB: 1961-05-24, 62 y.o..   MRN: [id number]  Chief Complaint  Patient presents with   Shoulder Pain    C/o having RT shoulder pain x 6 weeks.  Taking Ibuprofen and magnesium cream.    History of Present Illness:  Patient is in today for reassessment of chronic medical issues.  [name] has a history of hyperlipidemia. She is managed on simvastatin 40 mg daily.   [name] has a history of Type 2 diabetes. I had started her on metformin 500 mg daily. She noted she developed significant diarrhea issues with the medicine. She stopped this 2 weeks ago.  [name] notes an issue with right shoulder pain . She is finding it difficult to raise the arm and to pull at items at shoulder height. She also has difficulty reaching behind her. She has been using some ibuprofen 400 mg twice a day at least. She does not recall any specific injury to the shoulder.  Past Medical History: Patient Active Problem List   Diagnosis Date Noted   Depressive disorder 10/26/2020   CIN III (cervical intraepithelial neoplasia grade III) with severe dysplasia 10/04/2020   Pap smear of cervix shows high risk HPV present 09/28/2020   Obesity (BMI 30.0-34.9) 05/05/2019   Type 2 diabetes mellitus without complication, without long-term current use of insulin (HCC) 09/23/2016   Insomnia 09/23/2016   Hyperlipidemia 09/23/2016   Allergic rhinitis 09/23/2016   Past Surgical History:  Procedure Laterality Date   CERVICAL CONIZATION W/BX N/A 12/15/2020   Procedure: CONIZATION CERVIX WITH BIOPSY;  Surgeon: Lyn Henri, MD;  Location: [facility];  Service: Gynecology;  Laterality: N/A;   HYSTEROSCOPY WITH D & C N/A 12/15/2020   Procedure: DILATATION AND CURETTAGE  /HYSTEROSCOPY;  Surgeon: Lyn Henri, MD;  Location: [facility];  Service: Gynecology;  Laterality: N/A;   REDUCTION MAMMAPLASTY Bilateral 1997   Family History  Problem Relation Age of Onset   Cancer Mother        Breast, pancreatic   COPD Father    Cancer Father        Lung   Stroke Maternal Uncle    Heart disease Maternal Grandmother    Cancer Maternal Grandfather        Lung   Kidney disease Paternal Grandfather    Outpatient Medications Prior to Visit  Medication Sig Dispense Refill   Cinnamon 500 MG TABS Take 1,000 mg by mouth daily.     gabapentin (NEURONTIN) 100 MG capsule TAKE ONE CAPSULE BY MOUTH AT BEDTIME 90 capsule 3   ibuprofen (ADVIL) 800 MG tablet Take 1 tablet (800 mg total) by mouth every 8 (eight) hours as needed. 30 tablet 0   LORazepam (ATIVAN) 0.5 MG tablet Take 1 tablet (0.5 mg total) by mouth 2 (two) times daily as needed. 30 tablet 0   melatonin 5 MG TABS Take 5 mg by mouth at bedtime.     Multiple Vitamins-Minerals (AIRBORNE PO) Take by mouth daily.     PARoxetine (PAXIL) 40 MG tablet Take 1 tablet (40 mg total) by mouth daily. 90 tablet 3   simvastatin (ZOCOR) 40 MG tablet TAKE ONE TABLET BY MOUTH ONCE NIGHTLY AT BEDTIME 90 tablet 3   UNABLE TO FIND Take by mouth 3 (three)  times daily. Med Name:  GLUCOCIL SUPPLEMENT -- takes one at breakfast , one at lunch and two at supper     vitamin B-12 (CYANOCOBALAMIN) 500 MCG tablet Take 500 mcg by mouth daily.     metFORMIN (GLUCOPHAGE) 500 MG tablet Take 1 tablet (500 mg total) by mouth 2 (two) times daily with a meal. (Patient not taking: Reported on 08/27/2023) 180 tablet 3   triamcinolone cream (KENALOG) 0.1 % Apply 1 Application topically 2 (two) times daily. (Patient not taking: Reported on 08/27/2023) 30 g 1   No facility-administered medications prior to visit.   Allergies  Allergen Reactions   Father Johns Medicine [Dextromethorphan Hbr] Hives   Metformin And Related Diarrhea    Other Hives    Tussin cough medication   Objective:   Today's Vitals   08/27/23 0835  BP: 124/70  Pulse: 92  Temp: 97.9 F (36.6 C)  TempSrc: Temporal  SpO2: 97%  Weight: 194 lb 6.4 oz (88.2 kg)  Height: 5\' 6"  (1.676 m)   Body mass index is 31.38 kg/m.   General: Well developed, well nourished. No acute distress. Extremities: ROM limited due to pain. There is tenderness along the right glenohumeral joint line. No pain over the [facility]   joint. There is no subluxation or crepitance. There is increased pain with the empty can test, but no weakness. Some   pain with resisted biceps movement, but triceps is normal. Psych: Alert and oriented. Normal mood and affect.  Health Maintenance Due  Topic Date Due   HIV Screening  Never done   INFLUENZA VACCINE  06/12/2023     Assessment & Plan:   Problem List Items Addressed This Visit       Endocrine   Type 2 diabetes mellitus without complication, without long-term current use of insulin (HCC) - Primary    I will check an A1c today. If her A1c is above 7% would consider addition of a different diabetes medicine.      Relevant Orders   Glucose, random   Hemoglobin A1c     Musculoskeletal and Integument   Right rotator cuff tendinitis    History and exam is consistent with a right shoulder impingement syndrome, likely rotator cuff tendinitis. I will treat her with a 7-day course of naproxen 500 mg bid, heat, and pendulum exercises. If not improving/resolved with this, I would consider a steroid injection.      Relevant Medications   naproxen (NAPROSYN) 500 MG tablet     Other   Hyperlipidemia    Lipids are at goal. Continue simvastatin 40 mg daily.      Other Visit Diagnoses     Need for immunization against influenza       Relevant Orders   Flu vaccine trivalent PF, 6mos and older(Flulaval,Afluria,Fluarix,Fluzone) (Completed)       Return in about 3 months (around 11/27/2023) for Reassessment.   Loyola Mast, MD

## 2023-08-27 NOTE — Patient Instructions (Signed)
Shoulder Impingement Syndrome  Shoulder impingement syndrome is a condition that causes pain when the tendons of the rotator cuff become pinched. These tendons arise from the muscles that stabilize and move the shoulder joint (rotator cuff). Beneath the rotator cuff tendons is a fluid-filled sac (bursa) that allows the tendons to glide smoothly. The bursa may become swollen or irritated (bursitis). Bursitis, swelling in the rotator cuff tendons, or both conditions can decrease the amount of space under a bone in the shoulder joint (acromion), resulting in impingement. What are the causes? This condition may be caused by bursitis or swelling of the rotator cuff tendons, which may result from: Doing repeated overhead arm movements. Falling onto the shoulder. Weakness in the shoulder muscles. What increases the risk? You may be more likely to develop this condition if you: Play sports that involve throwing, such as baseball. Participate in sports such as tennis, volleyball, and swimming. Work as a Education administrator, Music therapist, or Pharmacologist. Some people are also more likely to develop this condition because of the shape of their acromion bone. What are the signs or symptoms? The main symptom of this condition is pain on the front or side of the shoulder. The pain may: Get worse when lifting or raising your arm. Get worse at night. Wake you up from sleeping. Feel sharp when the shoulder is moved and then fade to an ache. Other symptoms may include: Stiffness. Not being able to raise the arm above shoulder level or behind the body. Weakness. How is this diagnosed? This condition may be diagnosed based on: Your symptoms and medical history. A physical exam. Imaging tests, such as: X-rays. MRI. Ultrasound. How is this treated? This condition may be treated by: Resting your shoulder and avoiding all activities that cause pain or put stress on the shoulder. Putting ice on your shoulder. Taking  NSAIDs, such as ibuprofen, to help reduce pain and swelling. Having injections of medicines to numb the area and reduce inflammation. Doing exercises to improve movement and strength in your shoulder (physical therapy). Having surgery. This may be needed if other treatments have not helped. Surgery may involve repairing the rotator cuff, reshaping the acromion, or removing the bursa. Follow these instructions at home: Managing pain, stiffness, and swelling     If told, put ice on the injured area. Put ice in a plastic bag. Place a towel between your skin and the bag. Leave the ice on for 20 minutes, 2-3 times a day. If told, apply heat to the affected area as often as told by your health care provider. Use the heat source that your provider recommends, such as a moist heat pack or a heating pad. Place a towel between your skin and the heat source. Leave the heat on for 20-30 minutes. Remove the heat if your skin turns bright red. This is especially important if you are unable to feel pain, heat, or cold. You have a greater risk of getting burned. If your skin turns bright red, remove the ice or heat right away to prevent skin damage. The risk of damage is higher if you cannot feel pain, heat, or cold. Activity Avoid raising or lifting your arm above your affected shoulder as told. Rest as told by your provider. Return to your normal activities as told by your provider. Ask your provider what activities are safe for you. Do exercises as told by your provider. Ask your provider when it is safe for you to drive. General instructions Take over-the-counter and prescription  medicines only as told by your provider. Do not use any products that contain nicotine or tobacco. These products include cigarettes, chewing tobacco, and vaping devices, such as e-cigarettes. These can delay bone healing. If you need help quitting, ask your provider. Keep all follow-up visits. Your provider will check on  your condition and adjust treatments as needed. How is this prevented? Give your body time to rest between periods of activity. Be safe and responsible while being active. This will help you avoid falls. Maintain physical fitness. This includes strength in your shoulder muscles and back muscles. Contact a health care provider if: You cannot lift your arm away from your body. Your symptoms are getting worse. Your symptoms have not improved after 4-6 months of treatment. This information is not intended to replace advice given to you by your health care provider. Make sure you discuss any questions you have with your health care provider. Document Revised: 07/25/2022 Document Reviewed: 07/25/2022 Elsevier Patient Education  2024 [employer].

## 2023-08-29 MED ORDER — PIOGLITAZONE HCL 15 MG PO TABS: 15.0000 mg | ORAL_TABLET | Freq: Every day | ORAL | 11 refills | Status: DC

## 2023-08-29 NOTE — Addendum Note (Signed)
Addended by: Loyola Mast on: 08/29/2023 08:28 AM   Modules accepted: Orders

## 2023-09-12 ENCOUNTER — Ambulatory Visit: Payer: Self-pay | Admitting: Family Medicine

## 2023-12-05 ENCOUNTER — Other Ambulatory Visit: Payer: Self-pay | Admitting: Family Medicine

## 2023-12-05 DIAGNOSIS — F32A Depression, unspecified: Secondary | ICD-10-CM

## 2023-12-05 NOTE — Telephone Encounter (Signed)
Refill request for  Paxil 40 mg LR 12/03/22, #60, 3 rf LOV 08/27/23 FOV none scheduled  Left VM to call and schedule a f/u due 12/06/23 per las note.   Please review and advise.  Thanks.  Dm/cma

## 2023-12-08 ENCOUNTER — Ambulatory Visit (INDEPENDENT_AMBULATORY_CARE_PROVIDER_SITE_OTHER): Payer: Self-pay | Admitting: Family Medicine

## 2023-12-08 VITALS — BP 124/68 | HR 74 | Temp 97.1°F | Ht 66.0 in | Wt 190.4 lb

## 2023-12-08 DIAGNOSIS — E119 Type 2 diabetes mellitus without complications: Secondary | ICD-10-CM

## 2023-12-08 DIAGNOSIS — Z7984 Long term (current) use of oral hypoglycemic drugs: Secondary | ICD-10-CM

## 2023-12-08 DIAGNOSIS — E782 Mixed hyperlipidemia: Secondary | ICD-10-CM

## 2023-12-08 LAB — GLUCOSE, RANDOM: Glucose, Bld: 199 mg/dL — ABNORMAL HIGH (ref 70–99)

## 2023-12-08 LAB — HEMOGLOBIN A1C: Hgb A1c MFr Bld: 7.5 % — ABNORMAL HIGH (ref 4.6–6.5)

## 2023-12-08 MED ORDER — PIOGLITAZONE HCL 30 MG PO TABS: 30.0000 mg | ORAL_TABLET | Freq: Every day | ORAL | 3 refills | Status: DC

## 2023-12-08 NOTE — Addendum Note (Signed)
Addended by: Loyola Mast on: 12/08/2023 05:04 PM   Modules accepted: Orders

## 2023-12-08 NOTE — Assessment & Plan Note (Signed)
I will check an A1c today. If her A1c is above 7%, will plan to increase her pioglitazone to 30 mg daily.

## 2023-12-08 NOTE — Assessment & Plan Note (Signed)
Lipids are at goal. Continue simvastatin 40 mg daily.

## 2023-12-08 NOTE — Progress Notes (Signed)
[facility] [address] [place] [address] Dept: [phone] Dept Fax: [phone]  Chronic Care Office Visit  Subjective:    Patient ID: Julie Maynard, female    DOB: 08/23/1961, 63 y.o..   MRN: [id number]  Chief Complaint  Patient presents with   Diabetes   History of Present Illness:  Patient is in today for reassessment of chronic medical issues.  [name] has a history of hyperlipidemia. She is managed on simvastatin 40 mg daily.   [name] has a history of Type 2 diabetes. She did not tolerate metformin due to diarrhea. She is now on pioglitazone 15 mg daily. Her BP liog shows her fasting glucoses to generally be int he 140-160s.  Past Medical History: Patient Active Problem List   Diagnosis Date Noted   Right rotator cuff tendinitis 08/27/2023   Depressive disorder 10/26/2020   CIN III (cervical intraepithelial neoplasia grade III) with severe dysplasia 10/04/2020   Pap smear of cervix shows high risk HPV present 09/28/2020   Obesity (BMI 30.0-34.9) 05/05/2019   Type 2 diabetes mellitus without complication, without long-term current use of insulin (HCC) 09/23/2016   Insomnia 09/23/2016   Hyperlipidemia 09/23/2016   Allergic rhinitis 09/23/2016   Past Surgical History:  Procedure Laterality Date   CERVICAL CONIZATION W/BX N/A 12/15/2020   Procedure: CONIZATION CERVIX WITH BIOPSY;  Surgeon: Lyn Henri, MD;  Location: [facility];  Service: Gynecology;  Laterality: N/A;   HYSTEROSCOPY WITH D & C N/A 12/15/2020   Procedure: DILATATION AND CURETTAGE /HYSTEROSCOPY;  Surgeon: Lyn Henri, MD;  Location: [facility];  Service: Gynecology;  Laterality: N/A;   REDUCTION MAMMAPLASTY Bilateral 1997   Family History  Problem Relation Age of Onset   Cancer Mother        Breast, pancreatic   COPD Father    Cancer Father        Lung   Stroke Maternal Uncle    Heart  disease Maternal Grandmother    Cancer Maternal Grandfather        Lung   Kidney disease Paternal Grandfather    Outpatient Medications Prior to Visit  Medication Sig Dispense Refill   Cinnamon 500 MG TABS Take 1,000 mg by mouth daily.     gabapentin (NEURONTIN) 100 MG capsule TAKE ONE CAPSULE BY MOUTH AT BEDTIME 90 capsule 3   LORazepam (ATIVAN) 0.5 MG tablet Take 1 tablet (0.5 mg total) by mouth 2 (two) times daily as needed. 30 tablet 0   melatonin 5 MG TABS Take 5 mg by mouth at bedtime.     Multiple Vitamins-Minerals (AIRBORNE PO) Take by mouth daily.     naproxen (NAPROSYN) 500 MG tablet Take 1 tablet (500 mg total) by mouth 2 (two) times daily with a meal. 14 tablet 0   PARoxetine (PAXIL) 40 MG tablet TAKE 1 TABLET BY MOUTH ONCE DAILY 90 tablet 3   pioglitazone (ACTOS) 15 MG tablet Take 1 tablet (15 mg total) by mouth daily. 30 tablet 11   simvastatin (ZOCOR) 40 MG tablet TAKE ONE TABLET BY MOUTH ONCE NIGHTLY AT BEDTIME 90 tablet 3   UNABLE TO FIND Take by mouth 3 (three) times daily. Med Name:  GLUCOCIL SUPPLEMENT -- takes one at breakfast , one at lunch and two at supper     vitamin B-12 (CYANOCOBALAMIN) 500 MCG tablet Take 500 mcg by mouth daily.     ibuprofen (ADVIL) 800 MG tablet Take 1 tablet (  800 mg total) by mouth every 8 (eight) hours as needed. 30 tablet 0   No facility-administered medications prior to visit.   Allergies  Allergen Reactions   Father Johns Medicine [Dextromethorphan Hbr] Hives   Metformin And Related Diarrhea   Other Hives    Tussin cough medication   Objective:   Today's Vitals   12/08/23 0829  BP: 124/68  Pulse: 74  Temp: (!) 97.1 F (36.2 C)  TempSrc: Temporal  SpO2: 98%  Weight: 190 lb 6.4 oz (86.4 kg)  Height: 5\' 6"  (1.676 m)   Body mass index is 30.73 kg/m.   General: Well developed, well nourished. No acute distress. Feet- Skin intact. No sign of maceration between toes. Nails are normal. Dorsalis pedis and posterior   tibial  artery pulses are normal. 5.07 monofilament testing normal. Psych: Alert and oriented. Normal mood and affect.  Health Maintenance Due  Topic Date Due   HIV Screening  Never done   Pneumococcal Vaccine 41-60 Years old (2 of 2 - PCV) 09/01/2013     Assessment & Plan:   Problem List Items Addressed This Visit       Endocrine   Type 2 diabetes mellitus without complication, without long-term current use of insulin (HCC) - Primary   I will check an A1c today. If her A1c is above 7%, will plan to increase her pioglitazone to 30 mg daily.      Relevant Orders   Glucose, random   Hemoglobin A1c     Other   Hyperlipidemia   Lipids are at goal. Continue simvastatin 40 mg daily.       Return in about 3 months (around 03/07/2024) for Reassessment.   Loyola Mast, MD

## 2023-12-09 ENCOUNTER — Telehealth: Payer: Self-pay | Admitting: Family Medicine

## 2023-12-09 NOTE — Telephone Encounter (Signed)
Copied from CRM [id number]. Topic: General - Call Back - No Documentation >> Dec 09, 2023  9:46 AM Samuel Jester B wrote: Reason for CRM: Pt stated that she received a call from the office, pt would like a callback.

## 2023-12-09 NOTE — Telephone Encounter (Signed)
Pt is wanting a cb concerning her most recent lab results.

## 2023-12-09 NOTE — Telephone Encounter (Signed)
Patient notified VIA phone. Dm/cma

## 2024-05-31 ENCOUNTER — Other Ambulatory Visit: Payer: Self-pay | Admitting: Family Medicine

## 2024-09-01 ENCOUNTER — Other Ambulatory Visit: Payer: Self-pay | Admitting: Family Medicine

## 2024-09-22 ENCOUNTER — Ambulatory Visit: Payer: Self-pay | Admitting: Family Medicine

## 2024-09-22 ENCOUNTER — Ambulatory Visit (INDEPENDENT_AMBULATORY_CARE_PROVIDER_SITE_OTHER): Payer: Self-pay | Admitting: Family Medicine

## 2024-09-22 VITALS — BP 118/70 | HR 96 | Temp 97.9°F | Ht 66.0 in | Wt 213.0 lb

## 2024-09-22 DIAGNOSIS — E782 Mixed hyperlipidemia: Secondary | ICD-10-CM

## 2024-09-22 DIAGNOSIS — F32A Depression, unspecified: Secondary | ICD-10-CM

## 2024-09-22 DIAGNOSIS — Z1211 Encounter for screening for malignant neoplasm of colon: Secondary | ICD-10-CM

## 2024-09-22 DIAGNOSIS — E66811 Obesity, class 1: Secondary | ICD-10-CM

## 2024-09-22 DIAGNOSIS — Z7984 Long term (current) use of oral hypoglycemic drugs: Secondary | ICD-10-CM

## 2024-09-22 DIAGNOSIS — E119 Type 2 diabetes mellitus without complications: Secondary | ICD-10-CM

## 2024-09-22 DIAGNOSIS — Z23 Encounter for immunization: Secondary | ICD-10-CM

## 2024-09-22 LAB — URINALYSIS, ROUTINE W REFLEX MICROSCOPIC
Bilirubin Urine: NEGATIVE
Hgb urine dipstick: NEGATIVE
Ketones, ur: NEGATIVE
Leukocytes,Ua: NEGATIVE
Nitrite: NEGATIVE
Specific Gravity, Urine: 1.01 (ref 1.000–1.030)
Total Protein, Urine: NEGATIVE
Urine Glucose: NEGATIVE
Urobilinogen, UA: 0.2 (ref 0.0–1.0)
pH: 6.5 (ref 5.0–8.0)

## 2024-09-22 LAB — MICROALBUMIN / CREATININE URINE RATIO
Creatinine,U: 75.6 mg/dL
Microalb Creat Ratio: UNDETERMINED mg/g (ref 0.0–30.0)
Microalb, Ur: <0.7 mg/dL

## 2024-09-22 LAB — HEMOGLOBIN A1C: Hgb A1c MFr Bld: 6.6 % — ABNORMAL HIGH (ref 4.6–6.5)

## 2024-09-22 LAB — LIPID PANEL
Cholesterol: 243 mg/dL — ABNORMAL HIGH (ref 0–200)
HDL: 78.2 mg/dL (ref >39.00)
LDL Cholesterol: 131 mg/dL — ABNORMAL HIGH (ref 0–99)
NonHDL: 164.48
Total CHOL/HDL Ratio: 3
Triglycerides: 169 mg/dL — ABNORMAL HIGH (ref 0.0–149.0)
VLDL: 33.8 mg/dL (ref 0.0–40.0)

## 2024-09-22 LAB — BASIC METABOLIC PANEL WITH GFR
BUN: 14 mg/dL (ref 6–23)
CO2: 31 meq/L (ref 19–32)
Calcium: 9.7 mg/dL (ref 8.4–10.5)
Chloride: 101 meq/L (ref 96–112)
Creatinine, Ser: 0.7 mg/dL (ref 0.40–1.20)
GFR: 91.35 mL/min (ref >60.00)
Glucose, Bld: 177 mg/dL — ABNORMAL HIGH (ref 70–99)
Potassium: 4.2 meq/L (ref 3.5–5.1)
Sodium: 143 meq/L (ref 135–145)

## 2024-09-22 MED ORDER — PIOGLITAZONE HCL 30 MG PO TABS: 30.0000 mg | ORAL_TABLET | Freq: Every day | ORAL | 3 refills | Status: AC

## 2024-09-22 MED ORDER — PAROXETINE HCL 40 MG PO TABS: 40.0000 mg | ORAL_TABLET | Freq: Every day | ORAL | 3 refills | Status: AC

## 2024-09-22 NOTE — Assessment & Plan Note (Signed)
 Stable.     Continue paroxetine  40 mg daily.

## 2024-09-22 NOTE — Assessment & Plan Note (Addendum)
 I will recheck lipids today. Continue simvastatin  40 mg daily.

## 2024-09-22 NOTE — Assessment & Plan Note (Signed)
 Overall health is fair. Recommend she start a regular exercise routine. Discussed recommended screenings and immunizations.

## 2024-09-22 NOTE — Progress Notes (Addendum)
 [facility] [address] [place] [address] Dept: [phone] Dept Fax: [phone]  Chronic Care Office Visit  Subjective:    Patient ID: Julie Maynard, female    DOB: 1961/09/16, 63 y.o..   MRN: [id number]  Chief Complaint  Patient presents with   Diabetes    F/u DM.  Average BS 92, 107, 113, 134, 159.  Wants flu shot today.    History of Present Illness:  Patient is in today for reassessment of chronic medical conditions.   Julie Maynard has a history of hyperlipidemia. She is managed on simvastatin  40 mg daily.   Julie Maynard has a history of Type 2 diabetes. She did not tolerate metformin  due to diarrhea. She is now on pioglitazone  30 mg daily. She was increased to this dose after her last visit in Jan. She has noted her home blood sugars being generally under 120. She is concerned with her weight and recognizes her lack of exercise palsy a role in this.  Julie Maynard has a history of depression. She is managed on paroxetine  40 mg daily. Her mood has been stable.  Past Medical History: Patient Active Problem List   Diagnosis Date Noted   Right rotator cuff tendinitis 08/27/2023   Depressive disorder 10/26/2020   CIN III (cervical intraepithelial neoplasia grade III) with severe dysplasia 10/04/2020   Pap smear of cervix shows high risk HPV present 09/28/2020   Obesity (BMI 30.0-34.9) 05/05/2019   Type 2 diabetes mellitus without complication, without long-term current use of insulin (HCC) 09/23/2016   Insomnia 09/23/2016   Hyperlipidemia 09/23/2016   Allergic rhinitis 09/23/2016   Past Surgical History:  Procedure Laterality Date   CERVICAL CONIZATION W/BX N/A 12/15/2020   Procedure: CONIZATION CERVIX WITH BIOPSY;  Surgeon: Lequita Evalene LABOR, MD;  Location: [facility];  Service: Gynecology;  Laterality: N/A;   HYSTEROSCOPY WITH D & C N/A 12/15/2020   Procedure: DILATATION AND CURETTAGE /HYSTEROSCOPY;   Surgeon: Lequita Evalene LABOR, MD;  Location: [facility];  Service: Gynecology;  Laterality: N/A;   REDUCTION MAMMAPLASTY Bilateral 1997   Family History  Problem Relation Age of Onset   Cancer Mother        Breast, pancreatic   COPD Father    Cancer Father        Lung   Stroke Maternal Uncle    Heart disease Maternal Grandmother    Cancer Maternal Grandfather        Lung   Kidney disease Paternal Grandfather    Outpatient Medications Prior to Visit  Medication Sig Dispense Refill   Cinnamon 500 MG TABS Take 1,000 mg by mouth daily.     gabapentin  (NEURONTIN ) 100 MG capsule TAKE ONE CAPSULE BY MOUTH AT BEDTIME 90 capsule 3   LORazepam  (ATIVAN ) 0.5 MG tablet Take 1 tablet (0.5 mg total) by mouth 2 (two) times daily as needed. 30 tablet 0   melatonin 5 MG TABS Take 5 mg by mouth at bedtime.     Multiple Vitamins-Minerals (AIRBORNE PO) Take by mouth daily.     naproxen  (NAPROSYN ) 500 MG tablet Take 1 tablet (500 mg total) by mouth 2 (two) times daily with a meal. 14 tablet 0   PARoxetine  (PAXIL ) 40 MG tablet TAKE 1 TABLET BY MOUTH ONCE DAILY 90 tablet 3   pioglitazone  (ACTOS ) 30 MG tablet Take 1 tablet (30 mg total) by mouth daily. 90 tablet 3   simvastatin  (ZOCOR ) 40 MG tablet TAKE ONE  TABLET BY MOUTH ONCE NIGHTLY AT BEDTIME 90 tablet 3   UNABLE TO FIND Take by mouth 3 (three) times daily. Med Name:  GLUCOCIL SUPPLEMENT -- takes one at breakfast , one at lunch and two at supper     vitamin B-12 (CYANOCOBALAMIN) 500 MCG tablet Take 500 mcg by mouth daily.     No facility-administered medications prior to visit.   Allergies  Allergen Reactions   Father Johns Medicine [Dextromethorphan Hbr] Hives   Metformin  And Related Diarrhea   Other Hives    Tussin cough medication     Objective:   Today's Vitals   09/22/24 0952  BP: 118/70  Pulse: 96  Temp: 97.9 F (36.6 C)  TempSrc: Temporal  SpO2: 100%  Weight: 213 lb (96.6 kg)  Height: 5' 6 (1.676 m)   Body mass  index is 34.38 kg/m.   General: Well developed, well nourished. No acute distress. Psych: Alert and oriented. Normal mood and affect.  Health Maintenance Due  Topic Date Due   HIV Screening  Never done   Diabetic kidney evaluation - Urine ACR  Never done   Fecal DNA (Cologuard)  Never done   OPHTHALMOLOGY EXAM  02/12/2024   Mammogram  03/04/2024   Diabetic kidney evaluation - eGFR measurement  03/12/2024   HEMOGLOBIN A1C  06/06/2024     Assessment & Plan:   Problem List Items Addressed This Visit       Endocrine   Type 2 diabetes mellitus without complication, without long-term current use of insulin (HCC) - Primary   Last A1c was 7.5%. I will check her annual DM Labs today. Continue pioglitazone  30 mg daily.      Relevant Medications   pioglitazone  (ACTOS ) 30 MG tablet   Other Relevant Orders   Lipid panel   Microalbumin / creatinine urine ratio   Basic metabolic panel with GFR   Hemoglobin A1c   Urinalysis, Routine w reflex microscopic     Other   Depressive disorder   Stable. Continue paroxetine  40 mg daily.       Relevant Medications   PARoxetine  (PAXIL ) 40 MG tablet   Hyperlipidemia   I will recheck lipids today. Continue simvastatin  40 mg daily.      Relevant Orders   Lipid panel   Obesity (BMI 30.0-34.9)   Overall health is fair. Recommend she start a regular exercise routine. Discussed recommended screenings and immunizations.       Other Visit Diagnoses       Long term current use of oral hypoglycemic drug         Need for immunization against influenza       Relevant Orders   Flu vaccine trivalent PF, 6mos and older(Flulaval,Afluria,Fluarix,Fluzone) (Completed)     Need for pneumococcal 20-valent conjugate vaccination       Relevant Orders   Pneumococcal conjugate vaccine 20-valent (Completed)     Screening for colon cancer       Relevant Orders   Cologuard       Return in about 6 months (around 03/22/2025) for Reassessment.   Garnette CHRISTELLA Simpler, MD  I,Emily Lagle,acting as a scribe for Garnette CHRISTELLA Simpler, MD.,have documented all relevant documentation on the behalf of Garnette CHRISTELLA Simpler, MD.  I, Garnette CHRISTELLA Simpler, MD, have reviewed all documentation for this visit. The documentation on 09/22/2024 for the exam, diagnosis, procedures, and orders are all accurate and complete.

## 2024-09-22 NOTE — Addendum Note (Signed)
 Addended by: THEDORA GARNETTE HERO on: 09/22/2024 12:55 PM   Modules accepted: Orders

## 2024-09-22 NOTE — Assessment & Plan Note (Addendum)
 Last A1c was 7.5%. I will check her annual DM Labs today. Continue pioglitazone  30 mg daily.

## 2024-09-27 MED ORDER — ROSUVASTATIN CALCIUM 20 MG PO TABS: 20.0000 mg | ORAL_TABLET | Freq: Every day | ORAL | 3 refills | Status: AC

## 2024-11-23 LAB — COLOGUARD: COLOGUARD: NEGATIVE
